# Patient Record
Sex: Male | Born: 1960 | Race: White | Hispanic: No | Smoking: Current every day smoker
Health system: Southern US, Community
[De-identification: ages and names within clinical notes are randomized; demographics above are authoritative.]

## PROBLEM LIST (undated history)

## (undated) DIAGNOSIS — N200 Calculus of kidney: Secondary | ICD-10-CM

## (undated) DIAGNOSIS — J449 Chronic obstructive pulmonary disease, unspecified: Secondary | ICD-10-CM

## (undated) DIAGNOSIS — F319 Bipolar disorder, unspecified: Secondary | ICD-10-CM

## (undated) DIAGNOSIS — K5792 Diverticulitis of intestine, part unspecified, without perforation or abscess without bleeding: Secondary | ICD-10-CM

## (undated) DIAGNOSIS — G9341 Metabolic encephalopathy: Secondary | ICD-10-CM

## (undated) DIAGNOSIS — F32A Depression, unspecified: Secondary | ICD-10-CM

## (undated) DIAGNOSIS — K529 Noninfective gastroenteritis and colitis, unspecified: Secondary | ICD-10-CM

## (undated) DIAGNOSIS — G7000 Myasthenia gravis without (acute) exacerbation: Secondary | ICD-10-CM

## (undated) DIAGNOSIS — F329 Major depressive disorder, single episode, unspecified: Secondary | ICD-10-CM

## (undated) DIAGNOSIS — G20 Parkinson's disease: Secondary | ICD-10-CM

## (undated) DIAGNOSIS — F419 Anxiety disorder, unspecified: Secondary | ICD-10-CM

## (undated) DIAGNOSIS — G40909 Epilepsy, unspecified, not intractable, without status epilepticus: Secondary | ICD-10-CM

## (undated) DIAGNOSIS — G43909 Migraine, unspecified, not intractable, without status migrainosus: Secondary | ICD-10-CM

## (undated) DIAGNOSIS — K859 Acute pancreatitis without necrosis or infection, unspecified: Secondary | ICD-10-CM

## (undated) DIAGNOSIS — G35 Multiple sclerosis: Secondary | ICD-10-CM

## (undated) DIAGNOSIS — I10 Essential (primary) hypertension: Secondary | ICD-10-CM

## (undated) DIAGNOSIS — K219 Gastro-esophageal reflux disease without esophagitis: Secondary | ICD-10-CM

## (undated) HISTORY — PX: APPENDECTOMY: SHX54

---

## 2016-06-04 DIAGNOSIS — R571 Hypovolemic shock: Secondary | ICD-10-CM

## 2016-06-04 DIAGNOSIS — N19 Unspecified kidney failure: Secondary | ICD-10-CM

## 2016-06-04 DIAGNOSIS — F10929 Alcohol use, unspecified with intoxication, unspecified: Secondary | ICD-10-CM

## 2016-06-04 DIAGNOSIS — F329 Major depressive disorder, single episode, unspecified: Secondary | ICD-10-CM

## 2016-06-04 DIAGNOSIS — E872 Acidosis: Secondary | ICD-10-CM

## 2016-07-19 ENCOUNTER — Inpatient Hospital Stay
Admission: AD | Admit: 2016-07-19 | Discharge: 2016-07-26 | DRG: 885 | Disposition: A | Discharge status: Home / Self Care | Payer: No Typology Code available for payment source | Source: Intra-hospital | Attending: Psychiatry | Admitting: Psychiatry

## 2016-07-19 DIAGNOSIS — Z79899 Other long term (current) drug therapy: Secondary | ICD-10-CM | POA: Diagnosis not present

## 2016-07-19 DIAGNOSIS — G20 Parkinson's disease: Secondary | ICD-10-CM | POA: Diagnosis present

## 2016-07-19 DIAGNOSIS — F17210 Nicotine dependence, cigarettes, uncomplicated: Secondary | ICD-10-CM | POA: Diagnosis present

## 2016-07-19 DIAGNOSIS — Z818 Family history of other mental and behavioral disorders: Secondary | ICD-10-CM | POA: Diagnosis not present

## 2016-07-19 DIAGNOSIS — Z87442 Personal history of urinary calculi: Secondary | ICD-10-CM

## 2016-07-19 DIAGNOSIS — Z915 Personal history of self-harm: Secondary | ICD-10-CM | POA: Diagnosis not present

## 2016-07-19 DIAGNOSIS — R45851 Suicidal ideations: Secondary | ICD-10-CM | POA: Diagnosis present

## 2016-07-19 DIAGNOSIS — G35 Multiple sclerosis: Secondary | ICD-10-CM | POA: Diagnosis present

## 2016-07-19 DIAGNOSIS — F1020 Alcohol dependence, uncomplicated: Secondary | ICD-10-CM | POA: Diagnosis present

## 2016-07-19 DIAGNOSIS — F333 Major depressive disorder, recurrent, severe with psychotic symptoms: Principal | ICD-10-CM | POA: Diagnosis present

## 2016-07-19 DIAGNOSIS — F4310 Post-traumatic stress disorder, unspecified: Secondary | ICD-10-CM | POA: Diagnosis present

## 2016-07-19 DIAGNOSIS — J449 Chronic obstructive pulmonary disease, unspecified: Secondary | ICD-10-CM | POA: Diagnosis present

## 2016-07-19 DIAGNOSIS — K219 Gastro-esophageal reflux disease without esophagitis: Secondary | ICD-10-CM | POA: Diagnosis present

## 2016-07-19 DIAGNOSIS — Z716 Tobacco abuse counseling: Secondary | ICD-10-CM

## 2016-07-19 DIAGNOSIS — I10 Essential (primary) hypertension: Secondary | ICD-10-CM | POA: Diagnosis present

## 2016-07-19 DIAGNOSIS — Z9049 Acquired absence of other specified parts of digestive tract: Secondary | ICD-10-CM

## 2016-07-19 DIAGNOSIS — Z882 Allergy status to sulfonamides status: Secondary | ICD-10-CM | POA: Diagnosis not present

## 2016-07-19 DIAGNOSIS — G40909 Epilepsy, unspecified, not intractable, without status epilepticus: Secondary | ICD-10-CM | POA: Diagnosis present

## 2016-07-19 DIAGNOSIS — S5291XA Unspecified fracture of right forearm, initial encounter for closed fracture: Secondary | ICD-10-CM | POA: Diagnosis present

## 2016-07-19 DIAGNOSIS — F17200 Nicotine dependence, unspecified, uncomplicated: Secondary | ICD-10-CM | POA: Diagnosis present

## 2016-07-19 HISTORY — DX: Gastro-esophageal reflux disease without esophagitis: K21.9

## 2016-07-19 HISTORY — DX: Parkinson's disease: G20

## 2016-07-19 HISTORY — DX: Essential (primary) hypertension: I10

## 2016-07-19 HISTORY — DX: Diverticulitis of intestine, part unspecified, without perforation or abscess without bleeding: K57.92

## 2016-07-19 HISTORY — DX: Anxiety disorder, unspecified: F41.9

## 2016-07-19 HISTORY — DX: Migraine, unspecified, not intractable, without status migrainosus: G43.909

## 2016-07-19 HISTORY — DX: Epilepsy, unspecified, not intractable, without status epilepticus: G40.909

## 2016-07-19 HISTORY — DX: Bipolar disorder, unspecified: F31.9

## 2016-07-19 HISTORY — DX: Acute pancreatitis without necrosis or infection, unspecified: K85.90

## 2016-07-19 HISTORY — DX: Noninfective gastroenteritis and colitis, unspecified: K52.9

## 2016-07-19 HISTORY — DX: Calculus of kidney: N20.0

## 2016-07-19 HISTORY — DX: Metabolic encephalopathy: G93.41

## 2016-07-19 HISTORY — DX: Chronic obstructive pulmonary disease, unspecified: J44.9

## 2016-07-19 HISTORY — DX: Depression, unspecified: F32.A

## 2016-07-19 HISTORY — DX: Multiple sclerosis: G35

## 2016-07-19 HISTORY — DX: Major depressive disorder, single episode, unspecified: F32.9

## 2016-07-19 HISTORY — DX: Myasthenia gravis without (acute) exacerbation: G70.00

## 2016-07-19 MED ORDER — ALUM & MAG HYDROXIDE-SIMETH 200-200-20 MG/5ML PO SUSP: 30.0000 mL | ORAL | Status: DC | PRN

## 2016-07-19 MED ORDER — GABAPENTIN 300 MG PO CAPS
300.0000 mg | ORAL_CAPSULE | Freq: Two times a day (BID) | ORAL | Status: DC
  Administered 2016-07-19 – 2016-07-20 (×2): 300 mg via ORAL
  Filled 2016-07-19 (×2): qty 1

## 2016-07-19 MED ORDER — ACETAMINOPHEN 500 MG PO TABS
1000.0000 mg | ORAL_TABLET | Freq: Four times a day (QID) | ORAL | Status: DC | PRN
  Administered 2016-07-19 – 2016-07-20 (×2): 1000 mg via ORAL
  Filled 2016-07-19 (×2): qty 2

## 2016-07-19 MED ORDER — TRAZODONE HCL 100 MG PO TABS
150.0000 mg | ORAL_TABLET | Freq: Every day | ORAL | Status: DC
  Administered 2016-07-19 – 2016-07-25 (×7): 150 mg via ORAL
  Filled 2016-07-19 (×7): qty 1

## 2016-07-19 MED ORDER — METOPROLOL TARTRATE 25 MG PO TABS
12.5000 mg | ORAL_TABLET | Freq: Two times a day (BID) | ORAL | Status: DC
  Administered 2016-07-19 – 2016-07-23 (×8): 12.5 mg via ORAL
  Filled 2016-07-19 (×8): qty 1

## 2016-07-19 MED ORDER — FAMOTIDINE 20 MG PO TABS
40.0000 mg | ORAL_TABLET | Freq: Every day | ORAL | Status: DC
  Administered 2016-07-19 – 2016-07-25 (×7): 40 mg via ORAL
  Filled 2016-07-19 (×7): qty 2

## 2016-07-19 MED ORDER — ACETAMINOPHEN 325 MG PO TABS: 650.0000 mg | ORAL_TABLET | Freq: Four times a day (QID) | ORAL | Status: DC | PRN

## 2016-07-19 MED ORDER — IBUPROFEN 400 MG PO TABS
800.0000 mg | ORAL_TABLET | Freq: Four times a day (QID) | ORAL | Status: DC | PRN
  Administered 2016-07-22: 800 mg via ORAL
  Filled 2016-07-19: qty 2

## 2016-07-19 MED ORDER — SERTRALINE HCL 100 MG PO TABS
100.0000 mg | ORAL_TABLET | Freq: Every day | ORAL | Status: DC
  Administered 2016-07-20: 100 mg via ORAL
  Filled 2016-07-19: qty 1

## 2016-07-19 MED ORDER — UMECLIDINIUM BROMIDE 62.5 MCG/INH IN AEPB
1.0000 | INHALATION_SPRAY | Freq: Every day | RESPIRATORY_TRACT | Status: DC
  Administered 2016-07-20 – 2016-07-26 (×7): 1 via RESPIRATORY_TRACT
  Filled 2016-07-19: qty 7

## 2016-07-19 MED ORDER — TRAZODONE HCL 100 MG PO TABS: 100.0000 mg | ORAL_TABLET | Freq: Every evening | ORAL | Status: DC | PRN

## 2016-07-19 MED ORDER — MAGNESIUM HYDROXIDE 400 MG/5ML PO SUSP: 30.0000 mL | Freq: Every day | ORAL | Status: DC | PRN
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
## 2016-07-19 NOTE — [facility] Group Notes (Deleted)
[facility] Group Notes:  (Nursing/MHT/Case Management/Adjunct)  Date:  07/19/2016  Time:  9:20 PM  Type of Therapy:  Group Therapy  Participation Level:  Active  Participation Quality:  Appropriate  Affect:  Appropriate  Cognitive:  Appropriate  Insight:  Good  Engagement in Group:  Engaged  Modes of Intervention:  Education  Summary of Progress/Problems:  Lawrence Stanton 07/19/2016, 9:20 PM

## 2016-07-19 NOTE — Tx Team (Signed)
Initial Treatment Plan 07/19/2016 9:59 PM [name]. TKZ:[id number]    PATIENT STRESSORS: Financial difficulties Marital or family conflict Occupational concerns Substance abuse   PATIENT STRENGTHS: Ability for insight Average or above average intelligence Capable of independent living Supportive family/friends   PATIENT IDENTIFIED PROBLEMS: Depression  Suicidal Ideation  Substance abuse "sobriety of course"    "coping skills"  "how to speak my mind when someone jumps on me"           DISCHARGE CRITERIA:  Adequate post-discharge living arrangements Improved stabilization in mood, thinking, and/or behavior Motivation to continue treatment in a less acute level of care Need for constant or close observation no longer present Reduction of life-threatening or endangering symptoms to within safe limits Verbal commitment to aftercare and medication compliance  PRELIMINARY DISCHARGE PLAN: Attend aftercare/continuing care group Attend 12-step recovery group Outpatient therapy Placement in alternative living arrangements  PATIENT/FAMILY INVOLVEMENT: This treatment plan has been presented to and reviewed with the patient, Lawrence Stanton., and/or family member.  The patient and family have been given the opportunity to ask questions and make suggestions.  Cristela Felt, RN 07/19/2016, 9:59 PM

## 2016-07-20 ENCOUNTER — Encounter: Payer: Self-pay | Admitting: Psychiatry

## 2016-07-20 DIAGNOSIS — F333 Major depressive disorder, recurrent, severe with psychotic symptoms: Principal | ICD-10-CM

## 2016-07-20 DIAGNOSIS — S5291XA Unspecified fracture of right forearm, initial encounter for closed fracture: Secondary | ICD-10-CM | POA: Diagnosis present

## 2016-07-20 DIAGNOSIS — I10 Essential (primary) hypertension: Secondary | ICD-10-CM | POA: Diagnosis present

## 2016-07-20 DIAGNOSIS — F1020 Alcohol dependence, uncomplicated: Secondary | ICD-10-CM | POA: Diagnosis present

## 2016-07-20 DIAGNOSIS — F4310 Post-traumatic stress disorder, unspecified: Secondary | ICD-10-CM | POA: Diagnosis present

## 2016-07-20 DIAGNOSIS — F17200 Nicotine dependence, unspecified, uncomplicated: Secondary | ICD-10-CM | POA: Diagnosis present

## 2016-07-20 DIAGNOSIS — K219 Gastro-esophageal reflux disease without esophagitis: Secondary | ICD-10-CM | POA: Diagnosis present

## 2016-07-20 MED ORDER — HYDROCODONE-ACETAMINOPHEN 5-325 MG PO TABS
1.0000 | ORAL_TABLET | Freq: Four times a day (QID) | ORAL | Status: DC | PRN
  Administered 2016-07-20 – 2016-07-22 (×7): 1 via ORAL
  Filled 2016-07-20 (×7): qty 1

## 2016-07-20 MED ORDER — QUETIAPINE FUMARATE 25 MG PO TABS
25.0000 mg | ORAL_TABLET | Freq: Three times a day (TID) | ORAL | Status: DC
  Administered 2016-07-20 – 2016-07-23 (×9): 25 mg via ORAL
  Filled 2016-07-20 (×9): qty 1

## 2016-07-20 MED ORDER — QUETIAPINE FUMARATE 100 MG PO TABS
100.0000 mg | ORAL_TABLET | Freq: Every day | ORAL | Status: DC
  Administered 2016-07-20 – 2016-07-22 (×3): 100 mg via ORAL
  Filled 2016-07-20 (×3): qty 1

## 2016-07-20 MED ORDER — GABAPENTIN 300 MG PO CAPS
300.0000 mg | ORAL_CAPSULE | Freq: Three times a day (TID) | ORAL | Status: DC
  Administered 2016-07-20 – 2016-07-26 (×18): 300 mg via ORAL
  Filled 2016-07-20 (×18): qty 1

## 2016-07-20 MED ORDER — PRAZOSIN HCL 2 MG PO CAPS
2.0000 mg | ORAL_CAPSULE | Freq: Two times a day (BID) | ORAL | Status: DC
  Administered 2016-07-20 – 2016-07-22 (×4): 2 mg via ORAL
  Filled 2016-07-20 (×4): qty 1

## 2016-07-20 MED ORDER — VENLAFAXINE HCL ER 75 MG PO CP24
150.0000 mg | ORAL_CAPSULE | Freq: Every day | ORAL | Status: DC
  Administered 2016-07-20 – 2016-07-26 (×7): 150 mg via ORAL
  Filled 2016-07-20 (×7): qty 2

## 2016-07-20 MED ORDER — NICOTINE 21 MG/24HR TD PT24
21.0000 mg | MEDICATED_PATCH | Freq: Every day | TRANSDERMAL | Status: DC
  Administered 2016-07-20 – 2016-07-26 (×7): 21 mg via TRANSDERMAL
  Filled 2016-07-20 (×7): qty 1

## 2016-07-20 NOTE — Progress Notes (Signed)
Recreation Therapy Notes  INPATIENT RECREATION THERAPY ASSESSMENT  Patient Details Name: Lawrence Stanton. MRN: [id number] DOB: 1961-06-05 Today's Date: 07/20/2016  Patient Stressors: Relationship, Death, Friends, Other (Comment) (Girlfriend has been stressful and pt thinks they are broken up; step father died recently; lack of supportive friends; finances)  Coping Skills:   Isolate, Arguments, Substance Abuse, Avoidance, Exercise, Music, Sports, Other (Comment) (Go to [facility])  Personal Challenges: Communication, Concentration, Expressing Yourself, Problem-Solving, Relationships, Self-Esteem/Confidence, Social Interaction, Stress Management, Substance Abuse, Trusting Others  Leisure Interests (2+):  Nature - Therapist, music, Individual - Other (Comment) Administrator, Civil Service)  Awareness of Community Resources:  Yes  Community Resources:  [employer], Programmer, systems  Current Use: No  If no, Barriers?: Other (Comment) (Been down and depressed - no energy or will to do anything)  Patient Strengths:  Good personality, looks good for his age  Patient Identified Areas of Improvement:  Communication, social activity  Current Recreation Participation:  Watching football  Patient Goal for Hospitalization:  To prepare himself for a 6 month program  [place] of Residence:  [place] of Residence:  [place]   Current [place] (including self-harm):  No  Current HI:  No  Consent to Intern Participation: N/A   Jacquelynn Cree, LRT/CTRS 07/20/2016, 4:04 PM
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
## 2016-07-20 NOTE — Progress Notes (Signed)
Recreation Therapy Notes  Date: 9.12.17 Time: 1:00 pm Location: Craft Room  Group Topic: Self-expression  Goal Area(s) Addresses:  Patient will be able to identify a color that represents each emotion. Patient will verbalize benefit of using art as a means of self-expression. Patient will verbalize one emotion experienced while participating in activity.  Behavioral Response: Attentive, Interactive  Intervention: The Colors Within Me  Activity: Patients were given blank face worksheets and instructed to pick a color for each emotion they were feeling and show on the face how much of that emotion they were feeling.  Education: LRT educated patients on other forms of self-expression.  Education Outcome: Acknowledges education/In group clarification offered/Needs additional education.   Clinical Observations/Feedback: Patient completed activity by picking a color for each emotion and showing how much of that emotion he was feeling on the worksheet. Patient contributed to group discussion by stating what emotions he was feeling, how his emotions affect his treatment in the hospital, how he sees his emotions changing once he is d/c, that it was helpful to see his emotions on paper and why, and what steps he can take to change his emotions.  Jacquelynn Cree, LRT/CTRS 07/20/2016 3:26 PM

## 2016-07-20 NOTE — Progress Notes (Signed)
D: Patient has been trying to make contact with girl friend all day . Voice he doesn't have any thing else to wear while in the hospital. Patient stated slept fair last night .Stated appetite is fair energy level  Is normal. Stated concentration is good . Stated on Depression scale 8 , hopeless 7 and anxiety . 8( low 0-10 high) Denies suicidal  homicidal ideations  .  No auditory hallucinations  No pain concerns . Appropriate ADL'S. Interacting with peers and staff.  Patient voice of withdrawal symptoms . Voice of going to meeting   Today. A: Encourage patient participation with unit programming . Instruction  Given on  Medication , verbalize understanding. R: Voice no other concerns. Staff continue to monitor

## 2016-07-20 NOTE — Tx Team (Signed)
Interdisciplinary Treatment and Diagnostic Plan Update  07/20/2016 Time of Session: 10:30am Grayling CongressJames Kenneth [facility] Jr[facility] MRN: [id number]  Principal Diagnosis: Major depressive disorder, recurrent, severe with psychotic features (HCC)  Secondary Diagnoses: Principal Problem:   Major depressive disorder, recurrent, severe with psychotic features (HCC) Active Problems:   Alcohol use disorder, severe, dependence (HCC)   GERD (gastroesophageal reflux disease)   HTN (hypertension)   PTSD (post-traumatic stress disorder)   Tobacco use disorder   Closed right forearm fracture   Current Medications:  Current Facility-Administered Medications  Medication Dose Route Frequency Provider Last Rate Last Dose  . alum & mag hydroxide-simeth (MAALOX/MYLANTA) 200-200-20 MG/5ML suspension 30 mL  30 mL Oral Q4H PRN Jolanta B Pucilowska, MD      . famotidine (PEPCID) tablet 40 mg  40 mg Oral QHS Jimmy FootmanAndrea Hernandez-Gonzalez, MD   40 mg at 07/19/16 2233  . gabapentin (NEURONTIN) capsule 300 mg  300 mg Oral TID Shari ProwsJolanta B Pucilowska, MD   300 mg at 07/20/16 1224  . HYDROcodone-acetaminophen (NORCO/VICODIN) 5-325 MG per tablet 1 tablet  1 tablet Oral Q6H PRN Shari ProwsJolanta B Pucilowska, MD   1 tablet at 07/20/16 1228  . ibuprofen (ADVIL,MOTRIN) tablet 800 mg  800 mg Oral Q6H PRN Jimmy FootmanAndrea Hernandez-Gonzalez, MD      . magnesium hydroxide (MILK OF MAGNESIA) suspension 30 mL  30 mL Oral Daily PRN Jolanta B Pucilowska, MD      . metoprolol tartrate (LOPRESSOR) tablet 12.5 mg  12.5 mg Oral BID Jimmy FootmanAndrea Hernandez-Gonzalez, MD   12.5 mg at 07/20/16 0829  . nicotine (NICODERM CQ - dosed in mg/24 hours) patch 21 mg  21 mg Transdermal Daily Jolanta B Pucilowska, MD   21 mg at 07/20/16 1223  . prazosin (MINIPRESS) capsule 2 mg  2 mg Oral BID Shari ProwsJolanta B Pucilowska, MD   2 mg at 07/20/16 1223  . QUEtiapine (SEROQUEL) tablet 100 mg  100 mg Oral QHS Jolanta B Pucilowska, MD      . QUEtiapine (SEROQUEL) tablet 25 mg  25 mg Oral TID Shari ProwsJolanta B  Pucilowska, MD   25 mg at 07/20/16 1224  . traZODone (DESYREL) tablet 150 mg  150 mg Oral QHS Jimmy FootmanAndrea Hernandez-Gonzalez, MD   150 mg at 07/19/16 2233  . umeclidinium bromide (INCRUSE ELLIPTA) 62.5 MCG/INH 1 puff  1 puff Inhalation Daily Jimmy FootmanAndrea Hernandez-Gonzalez, MD   1 puff at 07/20/16 0906  . venlafaxine XR (EFFEXOR-XR) 24 hr capsule 150 mg  150 mg Oral Q breakfast Jolanta B Pucilowska, MD   150 mg at 07/20/16 1223   PTA Medications: Prescriptions Prior to Admission  Medication Sig Dispense Refill Last Dose  . chlordiazePOXIDE (LIBRIUM) 5 MG capsule Take 5 mg by mouth 2 (two) times daily.     . famotidine (PEPCID) 40 MG tablet Take 40 mg by mouth at bedtime.     . gabapentin (NEURONTIN) 300 MG capsule Take 300 mg by mouth 2 (two) times daily.     . metoprolol tartrate (LOPRESSOR) 25 MG tablet Take 12.5 mg by mouth 2 (two) times daily.     . sertraline (ZOLOFT) 100 MG tablet Take 100 mg by mouth daily.     . traZODone (DESYREL) 150 MG tablet Take 150 mg by mouth at bedtime.     Marland Kitchen. umeclidinium bromide (INCRUSE ELLIPTA) 62.5 MCG/INH AEPB Inhale 1 puff into the lungs daily.       Treatment Modalities: Medication Management, Group therapy, Case management,  1 to 1 session with clinician, Psychoeducation, Recreational therapy.  Physician Treatment Plan for Primary Diagnosis: Major depressive disorder, recurrent, severe with psychotic features (HCC) Long Term Goal(s): Improvement in symptoms so as ready for discharge   Short Term Goals: Ability to identify changes in lifestyle to reduce recurrence of condition will improve, Ability to verbalize feelings will improve, Ability to demonstrate self-control will improve, Ability to identify and develop effective coping behaviors will improve, Ability to maintain clinical measurements within normal limits will improve, Compliance with prescribed medications will improve and Ability to identify triggers associated with substance abuse/mental health  issues will improve  Medication Management: Evaluate patient's response, side effects, and tolerance of medication regimen.  Therapeutic Interventions: 1 to 1 sessions, Unit Group sessions and Medication administration.  Evaluation of Outcomes: Progressing  Physician Treatment Plan for Secondary Diagnosis: Principal Problem:   Major depressive disorder, recurrent, severe with psychotic features (HCC) Active Problems:   Alcohol use disorder, severe, dependence (HCC)   GERD (gastroesophageal reflux disease)   HTN (hypertension)   PTSD (post-traumatic stress disorder)   Tobacco use disorder   Closed right forearm fracture  Long Term Goal(s): Improvement in symptoms so as ready for discharge  Short Term Goals: Ability to identify changes in lifestyle to reduce recurrence of condition will improve, Ability to verbalize feelings will improve, Ability to demonstrate self-control will improve, Ability to identify and develop effective coping behaviors will improve, Ability to maintain clinical measurements within normal limits will improve, Compliance with prescribed medications will improve and Ability to identify triggers associated with substance abuse/mental health issues will improve  Medication Management: Evaluate patient's response, side effects, and tolerance of medication regimen.  Therapeutic Interventions: 1 to 1 sessions, Unit Group sessions and Medication administration.  Evaluation of Outcomes: Progressing   RN Treatment Plan for Primary Diagnosis: Major depressive disorder, recurrent, severe with psychotic features (HCC) Long Term Goal(s): Knowledge of disease and therapeutic regimen to maintain health will improve  Short Term Goals: Ability to remain free from injury will improve, Ability to participate in decision making will improve and Compliance with prescribed medications will improve  Medication Management: RN will administer medications as ordered by provider, will  assess and evaluate patient's response and provide education to patient for prescribed medication. RN will report any adverse and/or side effects to prescribing provider.  Therapeutic Interventions: 1 on 1 counseling sessions, Psychoeducation, Medication administration, Evaluate responses to treatment, Monitor vital signs and CBGs as ordered, Perform/monitor CIWA, COWS, AIMS and Fall Risk screenings as ordered, Perform wound care treatments as ordered.  Evaluation of Outcomes: Progressing   LCSW Treatment Plan for Primary Diagnosis: Major depressive disorder, recurrent, severe with psychotic features (HCC) Long Term Goal(s): Safe transition to appropriate next level of care at discharge, Engage patient in therapeutic group addressing interpersonal concerns.  Short Term Goals: Engage patient in aftercare planning with referrals and resources, Increase emotional regulation, Identify triggers associated with mental health/substance abuse issues and Increase skills for wellness and recovery  Therapeutic Interventions: Assess for all discharge needs, 1 to 1 time with Social worker, Explore available resources and support systems, Assess for adequacy in community support network, Educate family and significant other(s) on suicide prevention, Complete Psychosocial Assessment, Interpersonal group therapy.  Evaluation of Outcomes: Progressing   Progress in Treatment: Attending groups: Yes. Participating in groups: Yes. Taking medication as prescribed: Yes. Toleration medication: Yes. Family/Significant other contact made: No, will contact:    Patient understands diagnosis: Yes. Discussing patient identified problems/goals with staff: Yes. Medical problems stabilized or resolved: Yes. Denies suicidal/homicidal ideation: No  Issues/concerns per patient self-inventory: No. Other:     New problem(s) identified: No, Describe:     New Short Term/Long Term Goal(s):  Discharge Plan or Barriers:  Discharge home with grilfriend and follow up with outpatient medication management and therapy  Reason for Continuation of Hospitalization: Depression Medication stabilization Suicidal ideation  Estimated Length of Stay:2-3 days  Attendees: Patient:Lawrence Stanton 07/20/2016 4:40 PM  Physician: Kristine Linea, MD 07/20/2016 4:40 PM  Nursing: Hulan Amato, RN 07/20/2016 4:40 PM  RN Care Manager: 07/20/2016 4:40 PM  Social Worker: Jake Shark, LCSW 07/20/2016 4:40 PM  Recreational Therapist: Hershal Coria RN 07/20/2016 4:40 PM  Other:  07/20/2016 4:40 PM  Other:  07/20/2016 4:40 PM  Other: 07/20/2016 4:40 PM    Scribe for Treatment Team: Jeryl Columbia, MSW, LCSW 07/20/2016 4:40 PM

## 2016-07-20 NOTE — Plan of Care (Signed)
Problem: Coping: Goal: Ability to cope will improve Outcome: Progressing Working on coping skills   

## 2016-07-20 NOTE — [facility] Group Notes (Signed)
[facility] LCSW Group Therapy   07/20/2016 9:30am  Type of Therapy: Group Therapy   Participation Level: Active   Participation Quality: Attentive, Sharing and Supportive   Affect: Appropriate  Cognitive: Alert and Oriented   Insight: Developing/Improving and Engaged   Engagement in Therapy: Developing/Improving and Engaged   Modes of Intervention: Clarification, Confrontation, Discussion, Education, Exploration,  Limit-setting, Orientation, Problem-solving, Rapport Building, Dance movement psychotherapist, Socialization and Support  Summary of Progress/Problems: The topic for group therapy was feelings about diagnosis. Pt actively participated in group discussion on their past and current diagnosis and how they feel towards this. Pt also identified how society and family members judge them, based on their diagnosis as well as stereotypes and stigmas. Pt reports his diagnoses as bipolar and anxiety. He stated that his emotions are typically sad and feelings of worthlessness. Patient identified cognitive distortions as "I am not good enough." Pt's action plan is to remove himself from toxic environments.    Hampton Abbot, MSW, LCSWA 07/20/2016, 11:08AM

## 2016-07-20 NOTE — [facility] Group Notes (Addendum)
Goals Group  Date/Time: 9:00 AM  Type of Therapy and Topic: Group Therapy: Goals Group: SMART Goals  ?  Participation Level: Moderate  ?  Description of Group:  ?  The purpose of a daily goals group is to assist and guide patients in setting recovery/wellness-related goals. The objective is to set goals as they relate to the crisis in which they were admitted. Patients will be using SMART goal modalities to set measurable goals. Characteristics of realistic goals will be discussed and patients will be assisted in setting and processing how one will reach their goal. Facilitator will also assist patients in applying interventions and coping skills learned in psycho-education groups to the SMART goal and process how one will achieve defined goal.  ?  Therapeutic Goals:  ?  -Patients will develop and document one goal related to or their crisis in which brought them into treatment.  -Patients will be guided by LCSW using SMART goal setting modality in how to set a measurable, attainable, realistic and time sensitive goal.  -Patients will process barriers in reaching goal.  -Patients will process interventions in how to overcome and successful in reaching goal.  ?  Patient's Goal: Invited but did not attend. ?  Therapeutic Modalities:  Motivational Interviewing  Cognitive Behavioral Therapy  Crisis Intervention Model  SMART goals setting   Hampton Abbot, MSW, LCSW-A 07/20/2016, 9:25AM

## 2016-07-21 NOTE — Progress Notes (Signed)
Calm and cooperative. Flat affect. Attends groups. Med compliant. Patient requires frequent redirection in setting boundaries with another male patient on the unit. Pt found holding hands in the hall and break room; and standing outside patients room. C/o right wrist pian. Norco 1 tab given @ 2143 and 0358 for pain PRN, no voiced thoughts of hurting himself. No AV/H noted. Will continue to monitor behavior.

## 2016-07-21 NOTE — Plan of Care (Signed)
Problem: Safety: Goal: Ability to remain free from injury will improve Outcome: Progressing Patient remains free from injury. No voiced thoughts of hurting himself. No injuries noted.
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
## 2016-07-21 NOTE — [facility] Group Notes (Signed)
[facility] Group Notes:  (Nursing/MHT/Case Management/Adjunct)  Date:  07/21/2016  Time:  7:00 AM  Type of Therapy:  Group Therapy  Participation Level:  Active  Participation Quality:  Appropriate                             Affect:  Appropriate  Cognitive:  Appropriate  Insight:  Appropriate  Engagement in Group:  Engaged  Modes of Intervention:  Discussion  Summary of Progress/Problems:  Burt Ek 07/21/2016, 7:00 AM

## 2016-07-21 NOTE — [facility] Group Notes (Signed)
[facility] Group Notes:  (Nursing/MHT/Case Management/Adjunct)  Date:  07/21/2016  Time:  9:15 PM  Type of Therapy:  Evening Wrap-up Group  Participation Level:  Did Not Attend  Participation Quality:  N/A  Affect:  N/A  Cognitive:  N/A  Insight:  None  Engagement in Group:  N/A  Modes of Intervention:  Activity  Summary of Progress/Problems:  Tomasita Morrow 07/21/2016, 9:15 PM

## 2016-07-21 NOTE — Progress Notes (Signed)
Recreation Therapy Notes  Date: 09.13.17 Time: 1:00 pm Location: Craft Room  Group Topic: Self-esteem  Goal Area(s) Addresses:  Patient will be able to identify benefit of self-esteem. Patient will be able to identify ways to increase self-esteem.  Behavioral Response: Attentive, Interactive  Intervention: Self-Portrait  Activity: Patients were instructed to draw their self-portrait of how they were feeling. Patients were instructed to write their name on a piece of paper and a positive trait. Patient then wrote positive traits about peers. Patient were instructed to draw their self-portrait of how they felt after reading the feedback from their peers.  Education: LRT educated patients on ways they can increase their self-esteem.  Education Outcome: Acknowledges education/In group clarification offered   Clinical Observations/Feedback: Patient completed activity by drawing both self-portraits. Patients wrote positive traits about self and peers. Patient contributed to group discussion by stating what was different about his faces, how he felt after reading his peers comments, that it was not difficult to believe the comments, and how he can improve his self-esteem.  Jacquelynn CreeGreene,Kennede Lusk M, LRT/CTRS 07/21/2016 3:22 PM

## 2016-07-21 NOTE — Plan of Care (Signed)
Problem: [facility] Participation in Recreation Therapeutic Interventions Goal: STG-Patient will identify at least five coping skills for ** STG: Coping Skills - Within 4 treatment sessions, patient will verbalize at least 5 coping skills for substance abuse in each of 2 treatment sessions to decrease substance abuse post d/c.  Outcome: Progressing Treatment Session 1; Completed 1 out of 2: At approximately 11:50 am, LRT met with patient in community room. Patient verbalized 5 coping skills for substance abuse. LRT educated patient on leisure and why it is important to implement into his schedule. LRT educated and provided patient with blank schedules to help him plan his day and try to avoid using substances. LRT educated patient on healthy support systems.  Leonette Monarch, LRT/CTRS 09.13.17 3:38 pm Goal: STG-Other Recreation Therapy Goal (Specify) STG: Stress Management - Within 4 treatment sessions, patient will verbalize understanding of the stress management techniques in each of 2 treatment sessions to increase stress management skills post d/c.  Outcome: Progressing Treatment Session 1; Completed 1 out of 2: At approximately 11:50 am, LRT met with patient in community room. LRT educated and provided patient with handouts on stress management techniques. Patient verbalized understanding. LRT encouraged patient to read over and practice the stress management techniques.  Leonette Monarch, LRT/CTRS 09.13.17 3:40 pm

## 2016-07-21 NOTE — [facility] Group Notes (Signed)
[facility] Group Notes:  (Nursing/MHT/Case Management/Adjunct)  Date:  07/21/2016  Time:  5:36 PM  Type of Therapy:  Psychoeducational Skills  Participation Level:  Active  Participation Quality:  Appropriate, Attentive and Sharing  Affect:  Appropriate  Cognitive:  Alert and Appropriate  Insight:  Appropriate  Engagement in Group:  Engaged  Modes of Intervention:  Discussion, Education and Support  Summary of Progress/Problems:  Lawrence Stanton [facility] 07/21/2016, 5:36 PM

## 2016-07-21 NOTE — Plan of Care (Signed)
Problem: Coping: Goal: Ability to demonstrate self-control will improve Outcome: Progressing Working on coping skills information sheet given

## 2016-07-21 NOTE — Progress Notes (Signed)
Patient c/o lightheadedness and dizziness. V/s obtained at 0630 [id number] sitting; b/p 86/58- HR 93 standing; b/p 74/60-HR 89 standing. Fluids encouraged offered and accepted. Pt escorted back to room via w/c. Will continue to monitor for safety.

## 2016-07-21 NOTE — Progress Notes (Signed)
Patient  Voice of  Working on his self today as a goal. Affect cheerful on  Approach . Appropriate ADL's and personal chores . Appetite good and voice no concerns around sleep.   energy level lowl. Stated concentration is good . Stated on Depression scale 8 , hopeless 7  and anxiety 9.( low 0-10 high)  Behavior incongruent with  Numbers given .Denies suicidal  homicidal ideations  .  No auditory hallucinations  No pain concerns . Appropriate ADL'S. Interacting with peers and staff.  A: Encourage patient participation with unit programming . Instruction  Given on  Medication , verbalize understanding. R: Voice no other concerns. Staff continue to monitor

## 2016-07-21 NOTE — Progress Notes (Signed)
Patient ambulatory, states feeling much better. Pt requesting to wash clothing. Denies dizziness and lightheadedness. OOB to DR. Interacting with peers and watching tv. No s/s of distress noted. No c/o pain/discomfort noted.

## 2016-07-21 NOTE — [facility] Group Notes (Signed)
[facility] LCSW Group Therapy   07/21/2016  9:30 am   Type of Therapy: Group Therapy   Participation Level: Active   Participation Quality: Attentive, Sharing and Supportive   Affect:Appropriate  Cognitive: Alert and Oriented   Insight: Developing/Improving and Engaged   Engagement in Therapy: Developing/Improving and Engaged   Modes of Intervention: Clarification, Confrontation, Discussion, Education, Exploration, Limit-setting, Orientation, Problem-solving, Rapport Building, Dance movement psychotherapist, Socialization and Support   Summary of Progress/Problems: The topic for group today was emotional regulation. This group focused on both positive and negative emotion identification and allowed  group members to process ways to identify feelings, regulate negative emotions, and find healthy ways to manage internal/external emotions. Group members were asked to reflect on a time when their reaction to an emotion led to a negative outcome and explored how alternative responses using emotion regulation would have benefited them. Group members were also asked to discuss a time when emotion regulation was utilized when a negative emotion was experienced. Patient defined emotional regulation as "taking a break, and walking away." Pt identified a trigger for negative emotions as his girlfriend and his substance use. Pt stated inpatient treatment could reduce exposure to triggers.    Hampton Abbot, MSW, LCSWA 07/21/2016, 10:38AM

## 2016-07-21 NOTE — Progress Notes (Signed)
0730-blood pressure 109/75, HR 73. No s/s of acute distress noted. No c/o pain/discomfort noted.
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
## 2016-07-22 MED ORDER — IBUPROFEN 400 MG PO TABS
400.0000 mg | ORAL_TABLET | Freq: Two times a day (BID) | ORAL | Status: DC
  Administered 2016-07-22 – 2016-07-26 (×8): 400 mg via ORAL
  Filled 2016-07-22 (×8): qty 1

## 2016-07-22 MED ORDER — HYDROCODONE-ACETAMINOPHEN 5-325 MG PO TABS
2.0000 | ORAL_TABLET | ORAL | Status: DC | PRN
  Administered 2016-07-22 – 2016-07-26 (×12): 2 via ORAL
  Filled 2016-07-22 (×12): qty 2

## 2016-07-22 MED ORDER — IBUPROFEN 400 MG PO TABS: 400.0000 mg | ORAL_TABLET | Freq: Three times a day (TID) | ORAL | Status: DC

## 2016-07-22 MED ORDER — INFLUENZA VAC SPLIT QUAD 0.5 ML IM SUSY
0.5000 mL | PREFILLED_SYRINGE | INTRAMUSCULAR | Status: AC
  Administered 2016-07-23: 0.5 mL via INTRAMUSCULAR
  Filled 2016-07-22: qty 0.5

## 2016-07-22 NOTE — [facility] Group Notes (Signed)
[facility] LCSW Group Therapy   07/22/2016 9:30 am   Type of Therapy: Group Therapy   Participation Level: Active   Participation Quality: Attentive, Sharing and Supportive   Affect: Appropriate   Cognitive: Alert and Oriented   Insight: Developing/Improving and Engaged   Engagement in Therapy: Developing/Improving and Engaged   Modes of Intervention: Clarification, Confrontation, Discussion, Education, Exploration, Limit-setting, Orientation, Problem-solving, Rapport Building, Dance movement psychotherapisteality Testing, Socialization and Support   Summary of Progress/Problems: The topic for group was balance in life. Today's group focused on defining balance in one's own words, identifying things that can knock one off balance, and exploring healthy ways to maintain balance in life. Group members were asked to provide an example of a time when they felt off balance, describe how they handled that situation, and process healthier ways to regain balance in the future. Group members were asked to share the most important tool for maintaining balance that [facility] and how they plan to apply this method after discharge. Pt defined balance as stability in one's life. He stated that his life becomes unbalanced when he does not have support from his family and starts drinking. Pt identified inpatient treatment and a change in his environment as his action plan to create or restore balance in his life.  Hampton AbbotKadijah Maurya Nethery, MSW, LCSW-A 07/22/2016, 10:41AM

## 2016-07-22 NOTE — [facility] Group Notes (Signed)
[facility] Group Notes:  (Nursing/MHT/Case Management/Adjunct)  Date:  07/22/2016  Time:  4:26 PM  Type of Therapy:  Psychoeducational Skills  Participation Level:  Active  Participation Quality:  Appropriate, Attentive and Sharing  Affect:  Appropriate  Cognitive:  Alert and Appropriate  Insight:  Appropriate  Engagement in Group:  Engaged  Modes of Intervention:  Discussion, Education and Support  Summary of Progress/Problems:  Lawrence Stanton [facility] 07/22/2016, 4:26 PM

## 2016-07-22 NOTE — Progress Notes (Signed)
D: Pt is seen in the milieu interacting appropriately with staff and peers. Denies SI/HI/AVH at this time. Pt requests PRN pain medication appropriately. Pt discusses with Clinical research associatewriter that he hopes to distance himself from his girlfriend during treatment because "she drinks too which isn't good for me." A: Emotional support and encouragement provided. Medications administered with education. q15 minute safety checks maintained. R: Pt remains free from harm. Will continue to monitor.

## 2016-07-22 NOTE — Plan of Care (Signed)
Problem: Coping: Goal: Ability to verbalize feelings will improve Outcome: Progressing Open with feelings today. Reports he is anxious about discharge with surgery and returning to get his belongings at his home where his gf is. Support and encouragement provided. Will continue to monitor.

## 2016-07-22 NOTE — Tx Team (Signed)
Interdisciplinary Treatment and Diagnostic Plan Update  07/22/2016 Time of Session: 2:00pm Lawrence Stanton [facility]. MRN: [id number]  Principal Diagnosis: Major depressive disorder, recurrent, severe with psychotic features (HCC)  Secondary Diagnoses: Principal Problem:   Major depressive disorder, recurrent, severe with psychotic features (HCC) Active Problems:   Alcohol use disorder, severe, dependence (HCC)   GERD (gastroesophageal reflux disease)   HTN (hypertension)   PTSD (post-traumatic stress disorder)   Tobacco use disorder   Closed right forearm fracture   Current Medications:  Current Facility-Administered Medications  Medication Dose Route Frequency Provider Last Rate Last Dose  . alum & mag hydroxide-simeth (MAALOX/MYLANTA) 200-200-20 MG/5ML suspension 30 mL  30 mL Oral Q4H PRN Jolanta B Pucilowska, MD      . famotidine (PEPCID) tablet 40 mg  40 mg Oral QHS Jimmy Footman, MD   40 mg at 07/21/16 2130  . gabapentin (NEURONTIN) capsule 300 mg  300 mg Oral TID Shari Prows, MD   300 mg at 07/22/16 1154  . HYDROcodone-acetaminophen (NORCO/VICODIN) 5-325 MG per tablet 2 tablet  2 tablet Oral Q4H PRN Jimmy Footman, MD   2 tablet at 07/22/16 1324  . ibuprofen (ADVIL,MOTRIN) tablet 400 mg  400 mg Oral BID WC Jimmy Footman, MD      . Melene Muller ON 07/23/2016] Influenza vac split quadrivalent PF (FLUARIX) injection 0.5 mL  0.5 mL Intramuscular Tomorrow-1000 Jolanta B Pucilowska, MD      . magnesium hydroxide (MILK OF MAGNESIA) suspension 30 mL  30 mL Oral Daily PRN Jolanta B Pucilowska, MD      . metoprolol tartrate (LOPRESSOR) tablet 12.5 mg  12.5 mg Oral BID Jimmy Footman, MD   12.5 mg at 07/22/16 0825  . nicotine (NICODERM CQ - dosed in mg/24 hours) patch 21 mg  21 mg Transdermal Daily Shari Prows, MD   21 mg at 07/22/16 0828  . QUEtiapine (SEROQUEL) tablet 100 mg  100 mg Oral QHS Jolanta B Pucilowska, MD   100 mg at  07/21/16 2130  . QUEtiapine (SEROQUEL) tablet 25 mg  25 mg Oral TID Shari Prows, MD   25 mg at 07/22/16 1154  . traZODone (DESYREL) tablet 150 mg  150 mg Oral QHS Jimmy Footman, MD   150 mg at 07/21/16 2130  . umeclidinium bromide (INCRUSE ELLIPTA) 62.5 MCG/INH 1 puff  1 puff Inhalation Daily Jimmy Footman, MD   1 puff at 07/22/16 0828  . venlafaxine XR (EFFEXOR-XR) 24 hr capsule 150 mg  150 mg Oral Q breakfast Jolanta B Pucilowska, MD   150 mg at 07/22/16 0825   PTA Medications: Prescriptions Prior to Admission  Medication Sig Dispense Refill Last Dose  . chlordiazePOXIDE (LIBRIUM) 5 MG capsule Take 5 mg by mouth 2 (two) times daily.     . famotidine (PEPCID) 40 MG tablet Take 40 mg by mouth at bedtime.     . gabapentin (NEURONTIN) 300 MG capsule Take 300 mg by mouth 2 (two) times daily.     . metoprolol tartrate (LOPRESSOR) 25 MG tablet Take 12.5 mg by mouth 2 (two) times daily.     . sertraline (ZOLOFT) 100 MG tablet Take 100 mg by mouth daily.     . traZODone (DESYREL) 150 MG tablet Take 150 mg by mouth at bedtime.     Marland Kitchen umeclidinium bromide (INCRUSE ELLIPTA) 62.5 MCG/INH AEPB Inhale 1 puff into the lungs daily.       Treatment Modalities: Medication Management, Group therapy, Case management,  1 to 1 session  with clinician, Psychoeducation, Recreational therapy.   Physician Treatment Plan for Primary Diagnosis: Major depressive disorder, recurrent, severe with psychotic features (HCC) Long Term Goal(s): Improvement in symptoms so as ready for discharge   Short Term Goals: Ability to identify changes in lifestyle to reduce recurrence of condition will improve, Ability to verbalize feelings will improve, Ability to demonstrate self-control will improve, Ability to identify and develop effective coping behaviors will improve, Ability to maintain clinical measurements within normal limits will improve, Compliance with prescribed medications will improve and  Ability to identify triggers associated with substance abuse/mental health issues will improve  Medication Management: Evaluate patient's response, side effects, and tolerance of medication regimen.  Therapeutic Interventions: 1 to 1 sessions, Unit Group sessions and Medication administration.  Evaluation of Outcomes: Progressing  Physician Treatment Plan for Secondary Diagnosis: Principal Problem:   Major depressive disorder, recurrent, severe with psychotic features (HCC) Active Problems:   Alcohol use disorder, severe, dependence (HCC)   GERD (gastroesophageal reflux disease)   HTN (hypertension)   PTSD (post-traumatic stress disorder)   Tobacco use disorder   Closed right forearm fracture  Long Term Goal(s): Improvement in symptoms so as ready for discharge  Short Term Goals: Ability to identify changes in lifestyle to reduce recurrence of condition will improve, Ability to verbalize feelings will improve, Ability to demonstrate self-control will improve, Ability to identify and develop effective coping behaviors will improve, Ability to maintain clinical measurements within normal limits will improve, Compliance with prescribed medications will improve and Ability to identify triggers associated with substance abuse/mental health issues will improve  Medication Management: Evaluate patient's response, side effects, and tolerance of medication regimen.  Therapeutic Interventions: 1 to 1 sessions, Unit Group sessions and Medication administration.  Evaluation of Outcomes: Progressing   RN Treatment Plan for Primary Diagnosis: Major depressive disorder, recurrent, severe with psychotic features (HCC) Long Term Goal(s): Knowledge of disease and therapeutic regimen to maintain health will improve  Short Term Goals: Ability to remain free from injury will improve, Ability to participate in decision making will improve and Compliance with prescribed medications will  improve  Medication Management: RN will administer medications as ordered by provider, will assess and evaluate patient's response and provide education to patient for prescribed medication. RN will report any adverse and/or side effects to prescribing provider.  Therapeutic Interventions: 1 on 1 counseling sessions, Psychoeducation, Medication administration, Evaluate responses to treatment, Monitor vital signs and CBGs as ordered, Perform/monitor CIWA, COWS, AIMS and Fall Risk screenings as ordered, Perform wound care treatments as ordered.  Evaluation of Outcomes: Progressing   LCSW Treatment Plan for Primary Diagnosis: Major depressive disorder, recurrent, severe with psychotic features (HCC) Long Term Goal(s): Safe transition to appropriate next level of care at discharge, Engage patient in therapeutic group addressing interpersonal concerns.  Short Term Goals: Engage patient in aftercare planning with referrals and resources, Increase emotional regulation, Identify triggers associated with mental health/substance abuse issues and Increase skills for wellness and recovery  Therapeutic Interventions: Assess for all discharge needs, 1 to 1 time with Social worker, Explore available resources and support systems, Assess for adequacy in community support network, Educate family and significant other(s) on suicide prevention, Complete Psychosocial Assessment, Interpersonal group therapy.  Evaluation of Outcomes: Progressing   Progress in Treatment: Attending groups: Yes. Participating in groups: Yes. Taking medication as prescribed: Yes. Toleration medication: Yes. Family/Significant other contact made: Yes, individual(s) contacted:  mother Patient understands diagnosis: Yes. Discussing patient identified problems/goals with staff: Yes. Medical problems stabilized or  resolved: Yes. Denies suicidal/homicidal ideation: Yes. Issues/concerns per patient self-inventory: No. Other: n/a  New  problem(s) identified: None identified at this time.   New Short Term/Long Term Goal(s): None identified at this time.   Discharge Plan or Barriers: Patient will discharge to his mothers house and have follow-up with [facility].  Reason for Continuation of Hospitalization: Depression Medication stabilization  Estimated Length of Stay: 3 to 5 days  Attendees: Patient:Lawrence Ward ChattersKenneth Mccaster Jr. 07/22/2016 2:28 PM  Physician: Dr. Radene JourneyAndrea HernandezJayme Cloud- Gonzalez, MD 07/22/2016 2:28 PM  Nursing: Elenore PaddyJennifer Morrow, RN 07/22/2016 2:28 PM  RN Care Manager: 07/22/2016 2:28 PM  Social Worker: Fredrich BirksAmaris G. Garnette CzechSampson MSW, LCSWA 07/22/2016 2:28 PM  Recreational Therapist:  07/22/2016 2:28 PM  Other:  07/22/2016 2:28 PM  Other:  07/22/2016 2:28 PM  Other: 07/22/2016 2:28 PM    Scribe for Treatment Team: Arelia LongestAmaris G Mirabelle Cyphers, LCSWA 07/22/2016 2:34 PM

## 2016-07-22 NOTE — Plan of Care (Signed)
Problem: Medication: Goal: Compliance with prescribed medication regimen will improve Outcome: Progressing Pt taking medications as prescribed.  Problem: Health Behavior/Discharge Planning: Goal: Ability to identify changes in lifestyle to reduce recurrence of condition will improve Outcome: Progressing Pt discusses with writer his plan to attend a treatment facility. He states that he is working on distancing himself from his girlfriend d/t her drinking habits.
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
## 2016-07-22 NOTE — Progress Notes (Signed)
Pt awake, up on unit, appropriately socializing with peers today. Denies SI/HI/AVH. C/o tremors this morning. C/o pain to R wrist 8-9/10 most of the day. Takes pain med as ordered. Reports depression 8/10, hopelessness 7/10, and anxiety 8/10 (low 0-10 high). Medication compliant.   Safety maintained with every 15 minute checks. Support and encouragement provided. Medications administered as ordered. Will continue to monitor.

## 2016-07-23 MED ORDER — RISPERIDONE 1 MG PO TABS
2.0000 mg | ORAL_TABLET | Freq: Every day | ORAL | Status: DC
  Administered 2016-07-23 – 2016-07-25 (×3): 2 mg via ORAL
  Filled 2016-07-23 (×3): qty 2

## 2016-07-23 NOTE — Plan of Care (Signed)
Problem: Activity: Goal: Interest or engagement in leisure activities will improve Outcome: Progressing Patient actively attending group sessions.  Patient is showing interest in leisure activities.

## 2016-07-23 NOTE — [facility] Group Notes (Signed)
[facility] Group Notes:  (Nursing/MHT/Case Management/Adjunct)  Date:  07/23/2016  Time:  2:42 AM  Type of Therapy:  Group Therapy  Participation Level:  Active  Participation Quality:  Appropriate  Affect:  Appropriate  Cognitive:  Appropriate  Insight:  Appropriate  Engagement in Group:  Engaged  Modes of Intervention:  n/a  Summary of Progress/Problems:  Lawrence Stanton 07/23/2016, 2:42 AM

## 2016-07-23 NOTE — [facility] Suicide Risk Assessment (Signed)
[facility] INPATIENT:  Family/Significant Other Suicide Prevention Education  Suicide Prevention Education: (Late Entry from 07/22/2016) Education Completed;Michiel Sites (mother [phone]), has been identified by the patient as the family member/significant other with whom the patient will be residing, and identified as the person(s) who will aid the patient in the event of a mental health crisis (suicidal ideations/suicide attempt).  With written consent from the patient, the family member/significant other has been provided the following suicide prevention education, prior to the and/or following the discharge of the patient. Mother indicated she was okay with patient coming to live with her when discharged.   The suicide prevention education provided includes the following:  Suicide risk factors  Suicide prevention and interventions  National Suicide Hotline telephone number  [facility] assessment telephone number  [facility] Emergency Assistance 911  [facility] and/or Residential Mobile Crisis Unit telephone number  Request made of family/significant other to:  Remove weapons (e.g., guns, rifles, knives), all items previously/currently identified as safety concern.    Remove drugs/medications (over-the-counter, prescriptions, illicit drugs), all items previously/currently identified as a safety concern.  The family member/significant other verbalizes understanding of the suicide prevention education information provided.  The family member/significant other agrees to remove the items of safety concern listed above.  Dorien Bessent G. Garnette Czech MSW, LCSWA 07/23/2016, 9:02 AM
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
## 2016-07-23 NOTE — Plan of Care (Signed)
Problem: [facility] Participation in Recreation Therapeutic Interventions Goal: STG-Patient will identify at least five coping skills for ** STG: Coping Skills - Within 4 treatment sessions, patient will verbalize at least 5 coping skills for substance abuse in each of 2 treatment sessions to decrease substance abuse post d/c.  Outcome: Completed/Met Date Met: 07/23/16 Treatment Session 2; Completed 2 out of 2: At approximately 11:35 am, LRT met with patient in community room. Patient verbalized 5 coping skills for substance abuse. LRT encouraged patient to use his healthy coping skills to avoid using substances.  Leonette Monarch, LRT/CTRS 09.15.19 11:42 am Goal: STG-Other Recreation Therapy Goal (Specify) STG: Stress Management - Within 4 treatment sessions, patient will verbalize understanding of the stress management techniques in each of 2 treatment sessions to increase stress management skills post d/c.  Outcome: Completed/Met Date Met: 07/23/16 Treatment Session 2; Completed 2 out of 2: At approximately 11:35 am, LRT met with patient in community room. Patient reported he read over and practiced the stress management techniques. Patient verbalized understanding and reported the techniques were "somewhat helpful". LRT encouraged patient to continue practicing the stress management techniques.  Leonette Monarch, LRT/CTRS 09.15.17 11:45 am

## 2016-07-23 NOTE — Plan of Care (Signed)
Problem: Health Behavior/Discharge Planning: Goal: Compliance with therapeutic regimen will improve Outcome: Progressing Pt compliant with medication regimen.

## 2016-07-23 NOTE — Progress Notes (Signed)
Recreation Therapy Notes  Date: 09.15.17 Time: 1:00 pm Location: Craft Room  Group Topic: Leisure Education  Goal Area(s) Addresses:  Patient will identify activities for each letter of the alphabet. Patient will verbalize ability to integrate positive leisure into life post d/c. Patient will verbalize ability to use leisure as a Associate Professor.  Behavioral Response: Attentive, Interactive  Intervention: Leisure Alphabet  Activity: Patients were given a Leisure Information systems manager and instructed to pick healthy leisure activities for each letter of the alphabet.  Education: LRT educated patients on what they need to participate in leisure activities.  Education Outcome: Acknowledges education/In group clarification offered  Clinical Observations/Feedback: Patient completed activity by writing healthy leisure activities. Patient contributed to group discussion by stating healthy leisure activities, what he needs to participate in leisure, how he can put leisure activities back in his schedule, and what makes leisure a good coping skill.  Jacquelynn Cree, LRT/CTRS 07/23/2016 1:52 PM

## 2016-07-23 NOTE — Progress Notes (Signed)
D:  Patient denies SI/AVH/HI.  Patient interacted more with staff and peers today.  Patient complained of chronic back pain. A:  Patient administered scheduled medications.  Patient provided with a safe environment.  Patient encouraged to attend groups. R:  Patient cooperative with staff.  Patient safety maintained with 15 minute checks.
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
## 2016-07-23 NOTE — [facility] Group Notes (Signed)
[facility] LCSW Group Therapy   07/23/2016 9:30 AM   Type of Therapy: Group Therapy   Participation Level: Active   Participation Quality: Attentive, Sharing and Supportive   Affect: Appropriate   Cognitive: Alert and Oriented   Insight: Developing/Improving and Engaged   Engagement in Therapy: Developing/Improving and Engaged   Modes of Intervention: Clarification, Confrontation, Discussion, Education, Exploration, Limit-setting, Orientation, Problem-solving, Rapport Building, Dance movement psychotherapist, Socialization and Support   Summary of Progress/Problems: The topic for today was feelings about relapse. Pt discussed what relapse prevention is to them and identified triggers that they are on the path to relapse. Pt processed their feeling towards relapse and was able to relate to peers. Pt discussed coping skills that can be used for relapse prevention. Patient defined relapse as "trying to get help, but the help doesn't work." Pt stated that his triggers for relapse are conflict with his loved ones and going back to toxic environments. Pt identified creating healthy relationships and staying in treatment as ways to reduce relapse and continue his journey in recovery.     Hampton Abbot, MSW, [facility]

## 2016-07-24 LAB — LIPID PANEL
Cholesterol: 188 mg/dL (ref 0–200)
HDL: 54 mg/dL (ref >40)
LDL CALC: 100 mg/dL — AB (ref 0–99)
Total CHOL/HDL Ratio: 3.5 RATIO
Triglycerides: 169 mg/dL — ABNORMAL HIGH (ref <150)
VLDL: 34 mg/dL (ref 0–40)

## 2016-07-24 NOTE — Progress Notes (Signed)
D:Pt denies any SI/HI/AVH. Attended evening group. Complained of back pain. Affect depressed. Visible in milieu interacting appropriately with peers and staff.  A: Encouragement and support provided. PRN Norco given for pain, effective. q15 minute checks maintained for safety.  R: Remains safe on unit. Participates in his treatment plan. Appropriate during interaction. Voices no additional concerns at this time 
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
## 2016-07-24 NOTE — [facility] Group Notes (Signed)
[facility] LCSW Group Therapy  07/24/2016 2:24 PM  Type of Therapy:  Group Therapy  Participation Level:  Active  Participation Quality:  Appropriate and Sharing  Affect:  Appropriate  Cognitive:  Alert and Appropriate  Insight:  Developing/Improving  Engagement in Therapy:  Developing/Improving  Modes of Intervention:  Activity, Discussion, Education and Support  Summary of Progress/Problems:Safety Planning: Patients identified fears or worries surrounding discharge. Patients offered support to their peers and openly developed safety plans for their individual needs. Patients developed their own safety plan. Patients discussed their warning signs, coping strategies, support system with family and friends, identified mental health professionals, and how to keep their environments safe (ex. Removing unnecessary medications or removing weapons/guns). Patients then discussed their personalized safety plan with the group. Pt stated he has spoke with his girlfriend and let her know he thinks it's best to live with his mother when he discharges. Patient has insight that his drinking is an issue and wants to avoid his girlfriend due to his girlfriend drinking alcohol daily.    Karolyn Messing G. Garnette CzechSampson MSW, LCSWA 07/24/2016, 2:27 PM

## 2016-07-24 NOTE — Progress Notes (Signed)
D:  Patient denies SI/AVH/HI.  Patient alert and oriented x4.  Patient complained of chronic back and wrist pain. A:  Patient encouraged to attend group sessions.  Patient administered scheduled medications. R:  Patient safety maintained with 15 minute checks.  Patient attended group sessions.

## 2016-07-24 NOTE — [facility] Group Notes (Signed)
[facility] Group Notes:  (Nursing/MHT/Case Management/Adjunct)  Date:  07/24/2016  Time:  1:27 AM  Type of Therapy:  Psychoeducational Skills  Participation Level:  Active  Participation Quality:  Appropriate, Attentive, Sharing and Supportive  Affect:  Appropriate  Cognitive:  Alert and Appropriate  Insight:  Appropriate and Good  Engagement in Group:  Engaged  Modes of Intervention:  Discussion, Socialization and Support  Summary of Progress/Problems:  Lawrence Stanton 07/24/2016, 1:27 AM

## 2016-07-24 NOTE — Plan of Care (Signed)
Problem: Safety: Goal: Ability to remain free from injury will improve Outcome: Progressing Patient has remained free from injury.  Patient safety maintained with 15 minute checks.   

## 2016-07-25 LAB — HEMOGLOBIN A1C
Hgb A1c MFr Bld: 5.4 % (ref 4.8–5.6)
Mean Plasma Glucose: 108 mg/dL

## 2016-07-25 NOTE — Progress Notes (Signed)
D:Pt denies any SI/HI/AVH. Attended evening group. Complained of back pain. Affect depressed. Visible in milieu interacting appropriately with peers and staff.  A: Encouragement and support provided. PRN Norco given for pain, effective. q15 minute checks maintained for safety.  R: Remains safe on unit. Participates in his treatment plan. Appropriate during interaction. Voices no additional concerns at this time 

## 2016-07-25 NOTE — Progress Notes (Signed)
D: Patient denies SI/AVH/HI.  Patient alert and oriented x4.  Patient continues to complain of pain in his wrist and his back.  Patient interacting appropriately with peers and staff. A:  Patient offered encouragement and support.  Patient administered scheduled medications. R:  Patient attended group sessions and was appropriate and engaging in those groups.  Patient safety maintained with 15 minute checks.

## 2016-07-25 NOTE — [facility] Group Notes (Signed)
[facility] Group Notes:  (Nursing/MHT/Case Management/Adjunct)  Date:  07/25/2016  Time:  11:41 PM  Type of Therapy:  Psychoeducational Skills  Participation Level:  Active  Participation Quality:  Attentive  Affect:  Appropriate  Cognitive:  Appropriate  Insight:  Good  Engagement in Group:  Engaged  Modes of Intervention:  Support  Summary of Progress/Problems:  Lawrence Stanton 07/25/2016, 11:41 PM

## 2016-07-25 NOTE — Plan of Care (Signed)
Problem: Activity: Goal: Sleeping patterns will improve Outcome: Progressing Pt has been sleeping at least 6.5 hrs at night.

## 2016-07-25 NOTE — [facility] Group Notes (Signed)
[facility] LCSW Group Therapy  07/25/2016 1:58 PM  Type of Therapy:  Group Therapy  Participation Level:  Active  Participation Quality:  Appropriate  Affect:  Appropriate  Cognitive:  Alert  Insight:  Developing/Improving  Engagement in Therapy:  Engaged  Modes of Intervention:  Discussion, Education and Support  Summary of Progress/Problems: Self esteem: Patients discussed self esteem and how it impacts them. They discussed what aspects in their lives has influenced their self esteem. They were challenged to identify changes that are needed in order to improve self esteem. Patients participated in activity where they had to identify positive adjectives they felt described their personality. Patients shared with the group on the following areas: Things I am good at, What I like about my appearance, I've helped others by, What I value the most, compliments I have received, challenges I have overcome, thing that make me unique, and Times I've made others happy. Pt stated he feels he is a humble, kind, and open minded person. Pt states he is keeping an open mind about the residential program he will be going to shortly after discharge.    Loredana Medellin G. Garnette Czech MSW, LCSWA 07/25/2016, 1:58 PM

## 2016-07-25 NOTE — Plan of Care (Signed)
Problem: Safety: Goal: Periods of time without injury will increase Outcome: Progressing Patient has remained free from injury this shift.  Patient provided with a safe environment.  Patient safety maintained with 15 minute checks.
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
## 2016-07-26 MED ORDER — TRAZODONE HCL 150 MG PO TABS: 150.0000 mg | ORAL_TABLET | Freq: Every day | ORAL | 0 refills | Status: DC

## 2016-07-26 MED ORDER — GABAPENTIN 300 MG PO CAPS: 300.0000 mg | ORAL_CAPSULE | Freq: Three times a day (TID) | ORAL | 0 refills | Status: DC

## 2016-07-26 MED ORDER — VENLAFAXINE HCL ER 150 MG PO CP24: 150.0000 mg | ORAL_CAPSULE | Freq: Every day | ORAL | 0 refills | Status: DC

## 2016-07-26 MED ORDER — RISPERIDONE 2 MG PO TABS: 2.0000 mg | ORAL_TABLET | Freq: Every day | ORAL | 0 refills | Status: DC

## 2016-07-26 NOTE — Progress Notes (Signed)
D:Pt denies any SI/HI/AVH. Attended evening group. Complained of back pain. Affect depressed. Visible in milieu interacting appropriately with peers and staff.  A: Encouragement and support provided. PRN Norco given for pain, effective. q15 minute checks maintained for safety.  R: Remains safe on unit. Participates in his treatment plan. Appropriate during interaction. Voices no additional concerns at this time

## 2016-07-26 NOTE — Progress Notes (Signed)
Patient denies SI/HI, denies A/V hallucinations. Patient verbalizes understanding of discharge instructions, follow up care and prescriptions. Patient given all belongings from  locker. Patient escorted out by staff, transported by family. 
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
## 2016-07-26 NOTE — [facility] Group Notes (Signed)
[facility] LCSW Group Therapy   07/26/2016 9:30am Type of Therapy: Group Therapy: Overcoming Obstacles    Participation Level: Active   Participation Quality: Pt was unable to participate due to discharge.   Hampton AbbotKadijah Larua Collier, MSW, Theresia MajorsLCSWA

## 2016-07-26 NOTE — Tx Team (Signed)
Interdisciplinary Treatment and Diagnostic Plan Update  07/26/2016 Time of Session: 9:00am Lawrence Stanton [facility] MRN: [id number]  Principal Diagnosis: Major depressive disorder, recurrent, severe with psychotic features (HCC)  Secondary Diagnoses: Principal Problem:   Major depressive disorder, recurrent, severe with psychotic features (HCC) Active Problems:   Alcohol use disorder, severe, dependence (HCC)   GERD (gastroesophageal reflux disease)   HTN (hypertension)   PTSD (post-traumatic stress disorder)   Tobacco use disorder   Closed right forearm fracture   Current Medications:  No current facility-administered medications for this encounter.    Current Outpatient Prescriptions  Medication Sig Dispense Refill  . famotidine (PEPCID) 40 MG tablet Take 40 mg by mouth at bedtime.    Marland Kitchen umeclidinium bromide (INCRUSE ELLIPTA) 62.5 MCG/INH AEPB Inhale 1 puff into the lungs daily.    Marland Kitchen gabapentin (NEURONTIN) 300 MG capsule Take 1 capsule (300 mg total) by mouth 3 (three) times daily. 90 capsule 0  . risperiDONE (RISPERDAL) 2 MG tablet Take 1 tablet (2 mg total) by mouth at bedtime. 30 tablet 0  . traZODone (DESYREL) 150 MG tablet Take 1 tablet (150 mg total) by mouth at bedtime. 30 tablet 0  . [START ON 07/27/2016] venlafaxine XR (EFFEXOR-XR) 150 MG 24 hr capsule Take 1 capsule (150 mg total) by mouth daily with breakfast. 30 capsule 0   PTA Medications: No prescriptions prior to admission.    Treatment Modalities: Medication Management, Group therapy, Case management,  1 to 1 session with clinician, Psychoeducation, Recreational therapy.   Physician Treatment Plan for Primary Diagnosis: Major depressive disorder, recurrent, severe with psychotic features (HCC) Long Term Goal(s): Improvement in symptoms so as ready for discharge   Short Term Goals: Ability to identify changes in lifestyle to reduce recurrence of condition will improve, Ability to verbalize feelings will  improve, Ability to demonstrate self-control will improve, Ability to identify and develop effective coping behaviors will improve, Ability to maintain clinical measurements within normal limits will improve, Compliance with prescribed medications will improve and Ability to identify triggers associated with substance abuse/mental health issues will improve  Medication Management: Evaluate patient's response, side effects, and tolerance of medication regimen.  Therapeutic Interventions: 1 to 1 sessions, Unit Group sessions and Medication administration.  Evaluation of Outcomes:Adequate for discharge  Physician Treatment Plan for Secondary Diagnosis: Principal Problem:   Major depressive disorder, recurrent, severe with psychotic features (HCC) Active Problems:   Alcohol use disorder, severe, dependence (HCC)   GERD (gastroesophageal reflux disease)   HTN (hypertension)   PTSD (post-traumatic stress disorder)   Tobacco use disorder   Closed right forearm fracture  Long Term Goal(s): Improvement in symptoms so as ready for discharge  Short Term Goals: Ability to identify changes in lifestyle to reduce recurrence of condition will improve, Ability to verbalize feelings will improve, Ability to demonstrate self-control will improve, Ability to identify and develop effective coping behaviors will improve, Ability to maintain clinical measurements within normal limits will improve, Compliance with prescribed medications will improve and Ability to identify triggers associated with substance abuse/mental health issues will improve  Medication Management: Evaluate patient's response, side effects, and tolerance of medication regimen.  Therapeutic Interventions: 1 to 1 sessions, Unit Group sessions and Medication administration.  Evaluation of Outcomes: Adequate for discharge    RN Treatment Plan for Primary Diagnosis: Major depressive disorder, recurrent, severe with psychotic  features (HCC) Long Term Goal(s): Knowledge of disease and therapeutic regimen to maintain health will improve  Short Term Goals: Ability to remain free  from injury will improve, Ability to participate in decision making will improve and Compliance with prescribed medications will improve  Medication Management: RN will administer medications as ordered by provider, will assess and evaluate patient's response and provide education to patient for prescribed medication. RN will report any adverse and/or side effects to prescribing provider.  Therapeutic Interventions: 1 on 1 counseling sessions, Psychoeducation, Medication administration, Evaluate responses to treatment, Monitor vital signs and CBGs as ordered, Perform/monitor CIWA, COWS, AIMS and Fall Risk screenings as ordered, Perform wound care treatments as ordered.  Evaluation of Outcomes: Adequate for discharge    LCSW Treatment Plan for Primary Diagnosis: Major depressive disorder, recurrent, severe with psychotic features (HCC) Long Term Goal(s): Safe transition to appropriate next level of care at discharge, Engage patient in therapeutic group addressing interpersonal concerns.  Short Term Goals: Engage patient in aftercare planning with referrals and resources, Increase emotional regulation, Identify triggers associated with mental health/substance abuse issues and Increase skills for wellness and recovery  Therapeutic Interventions: Assess for all discharge needs, 1 to 1 time with Social worker, Explore available resources and support systems, Assess for adequacy in community support network, Educate family and significant other(s) on suicide prevention, Complete Psychosocial Assessment, Interpersonal group therapy.  Evaluation of Outcomes: Adequate for discharge    Progress in Treatment: Attending groups: Yes. Participating in groups: Yes. Taking medication as prescribed: Yes. Toleration medication:  Yes. Family/Significant other contact made: Yes, individual(s) contacted:  mother Patient understands diagnosis: Yes. Discussing patient identified problems/goals with staff: Yes. Medical problems stabilized or resolved: Yes. Denies suicidal/homicidal ideation: Yes. Issues/concerns per patient self-inventory: No. Other: n/a  New problem(s) identified: None identified at this time.   New Short Term/Long Term Goal(s): None identified at this time.   Discharge Plan or Barriers: Patient will discharge to his mothers house and have follow-up with [facility].  Reason for Continuation of Hospitalization: NONE  Estimated Length of Stay:0days  Attendees: Patient:Lawrence Stanton 07/26/2016 5:48 PM  Physician: Radene JourneyAndrea Hernandez, MD 07/26/2016 5:48 PM  Nursing:  07/26/2016 5:48 PM  RN Care Manager: 07/26/2016 5:48 PM  Social Worker: Marshell GarfinkelSara Mariaha Ellington,LCSW 07/26/2016 5:48 PM  Recreational Therapist:  07/26/2016 5:48 PM  Other:  07/26/2016 5:48 PM  Other:  07/26/2016 5:48 PM  Other: 07/26/2016 5:48 PM    Scribe for Treatment Team: Glennon MacSara P Murle Hellstrom, LCSW 07/26/2016 5:48 PM

## 2016-07-26 NOTE — [facility] Suicide Risk Assessment (Signed)
[facility] Discharge Suicide Risk Assessment   Principal Problem: Major depressive disorder, recurrent, severe with psychotic features [facility]) Discharge Diagnoses:  Patient Active Problem List   Diagnosis Date Noted  . Alcohol use disorder, severe, dependence (HCC) [F10.20] 07/20/2016  . GERD (gastroesophageal reflux disease) [K21.9] 07/20/2016  . HTN (hypertension) [I10] 07/20/2016  . PTSD (post-traumatic stress disorder) [F43.10] 07/20/2016  . Tobacco use disorder [F17.200] 07/20/2016  . Closed right forearm fracture [S52.91XA] 07/20/2016  . Major depressive disorder, recurrent, severe with psychotic features (HCC) [F33.3] 07/19/2016      Psychiatric Specialty Exam: ROS  Blood pressure (!) 122/91, pulse (!) 102, temperature 98 F (36.7 C), temperature source Oral, resp. rate 20, height 5\' 10"  (1.778 m), weight 78.9 kg (174 lb), SpO2 95 %.Body mass index is 24.97 kg/m.                                                        Mental Status Per Nursing Assessment::   On Admission:  Self-harm behaviors  Demographic Factors:  Male and Caucasian  Loss Factors: Legal issues  Historical Factors: Impulsivity  Risk Reduction Factors:   Sense of responsibility to family, Living with another person, especially a relative and Positive social support  No access to guns  Continued Clinical Symptoms:  Depression:   Comorbid alcohol abuse/dependence Alcohol/Substance Abuse/Dependencies Previous Psychiatric Diagnoses and Treatments  Cognitive Features That Contribute To Risk:  None    Suicide Risk:  Minimal: No identifiable suicidal ideation.  Patients presenting with no risk factors but with morbid ruminations; may be classified as minimal risk based on the severity of the depressive symptoms  Follow-up Information    [facility] Follow up on 07/28/2016.   Why:  Please arrive for your follow-up appointment on Wednesday 07/28/16 at 10:30am. If need to  reschedule please call the number listed.  Contact information: [address] [place], [place] [address] Phone: [phone] Fax: [phone]           Jimmy Footman, MD 07/26/2016, 9:02 AM

## 2016-07-26 NOTE — Plan of Care (Signed)
Problem: Coping: Goal: Ability to verbalize frustrations and anger appropriately will improve Outcome: Progressing Able to speak to GF on phone without having outbursts. Verbalizes feelings appropriately.

## 2016-07-26 NOTE — Progress Notes (Signed)
Recreation Therapy Notes  INPATIENT RECREATION TR PLAN  Patient Details Name: Lawrence Stanton. MRN: [id number] DOB: 06/10/1961 Today's Date: 07/26/2016  Rec Therapy Plan Is patient appropriate for Therapeutic Recreation?: Yes Treatment times per week: At least once a week TR Treatment/Interventions: 1:1 session, Group participation (Comment) (Appropriate participation in daily recreational therapy tx)  Discharge Criteria Pt will be discharged from therapy if:: Treatment goals are met, Discharged Treatment plan/goals/alternatives discussed and agreed upon by:: Patient/family  Discharge Summary Short term goals set: See Care Plan Short term goals met: Complete Progress toward goals comments: One-to-one attended Which groups?: Self-esteem, Leisure education, Other (Comment) (Self-expression) One-to-one attended: Stress Management, Coping Skills Reason goals not met: N/A Therapeutic equipment acquired: None Reason patient discharged from therapy: Discharge from hospital Pt/family agrees with progress & goals achieved: Yes Date patient discharged from therapy: 07/26/16   Leonette Monarch, LRT/CTRS 07/26/2016, 2:44 PM

## 2016-07-28 NOTE — Progress Notes (Signed)
  [facility] Adult Case Management Discharge Plan : Late Entry for CSW S Laws LCSW  Will you be returning to the same living situation after discharge:  No.   Will live w mother At discharge, do you have transportation home?: Yes,  family Do you have the ability to pay for your medications: No.  Referred to provider who can assist  Release of information consent forms completed and in the chart;  Patient's signature needed at discharge.  Patient to Follow up at: Follow-up Information    [facility] Follow up on 07/28/2016.   Why:  Please arrive for your follow-up appointment on Wednesday 07/28/16 at 10:30am. If need to reschedule please call the number listed.  Contact information: [address] [place], [place] [address] Phone: [phone] Fax: [phone]          Next level of care provider has access to [facility] Link:no  Safety Planning and Suicide Prevention discussed: Yes,  w mother  Have you used any form of tobacco in the last 30 days? (Cigarettes, Smokeless Tobacco, Cigars, and/or Pipes): Yes  Has patient been referred to the Quitline?: Patient refused referral  Patient has been referred for addiction treatment: Yes  Sallee Lange 07/28/2016, 5:48 PM

## 2016-10-04 ENCOUNTER — Emergency Department (HOSPITAL_COMMUNITY)
Admission: EM | Admit: 2016-10-04 | Discharge: 2016-10-05 | Disposition: A | Discharge status: Other Acute Inpatient Hospital | Payer: Self-pay | Attending: Emergency Medicine | Admitting: Emergency Medicine

## 2016-10-04 ENCOUNTER — Encounter (HOSPITAL_COMMUNITY): Payer: Self-pay | Admitting: Emergency Medicine

## 2016-10-04 DIAGNOSIS — F1010 Alcohol abuse, uncomplicated: Secondary | ICD-10-CM | POA: Insufficient documentation

## 2016-10-04 DIAGNOSIS — J449 Chronic obstructive pulmonary disease, unspecified: Secondary | ICD-10-CM | POA: Insufficient documentation

## 2016-10-04 DIAGNOSIS — I10 Essential (primary) hypertension: Secondary | ICD-10-CM | POA: Insufficient documentation

## 2016-10-04 DIAGNOSIS — G20 Parkinson's disease: Secondary | ICD-10-CM | POA: Insufficient documentation

## 2016-10-04 DIAGNOSIS — F333 Major depressive disorder, recurrent, severe with psychotic symptoms: Secondary | ICD-10-CM | POA: Insufficient documentation

## 2016-10-04 DIAGNOSIS — Z79899 Other long term (current) drug therapy: Secondary | ICD-10-CM | POA: Insufficient documentation

## 2016-10-04 DIAGNOSIS — F17210 Nicotine dependence, cigarettes, uncomplicated: Secondary | ICD-10-CM | POA: Insufficient documentation

## 2016-10-04 LAB — ETHANOL: Alcohol, Ethyl (B): 351 mg/dL (ref <5)

## 2016-10-04 LAB — RAPID URINE DRUG SCREEN, HOSP PERFORMED
AMPHETAMINES: NOT DETECTED
BARBITURATES: NOT DETECTED
Benzodiazepines: NOT DETECTED
COCAINE: NOT DETECTED
OPIATES: NOT DETECTED
TETRAHYDROCANNABINOL: NOT DETECTED

## 2016-10-04 LAB — COMPREHENSIVE METABOLIC PANEL
ALBUMIN: 4.7 g/dL (ref 3.5–5.0)
ALK PHOS: 104 U/L (ref 38–126)
ALT: 47 U/L (ref 17–63)
ANION GAP: 13 (ref 5–15)
AST: 53 U/L — ABNORMAL HIGH (ref 15–41)
BUN: 10 mg/dL (ref 6–20)
CALCIUM: 9.5 mg/dL (ref 8.9–10.3)
CHLORIDE: 107 mmol/L (ref 101–111)
CO2: 19 mmol/L — AB (ref 22–32)
CREATININE: 0.82 mg/dL (ref 0.61–1.24)
GFR calc non Af Amer: >60 mL/min (ref >60)
GLUCOSE: 115 mg/dL — AB (ref 65–99)
Potassium: 4.6 mmol/L (ref 3.5–5.1)
SODIUM: 139 mmol/L (ref 135–145)
Total Bilirubin: 0.9 mg/dL (ref 0.3–1.2)
Total Protein: 7.8 g/dL (ref 6.5–8.1)

## 2016-10-04 LAB — CBC
HEMATOCRIT: 49.9 % (ref 39.0–52.0)
HEMOGLOBIN: 17.5 g/dL — AB (ref 13.0–17.0)
MCH: 35.1 pg — ABNORMAL HIGH (ref 26.0–34.0)
MCHC: 35.1 g/dL (ref 30.0–36.0)
MCV: 100 fL (ref 78.0–100.0)
Platelets: 144 10*3/uL — ABNORMAL LOW (ref 150–400)
RBC: 4.99 MIL/uL (ref 4.22–5.81)
RDW: 14.1 % (ref 11.5–15.5)
WBC: 6.5 10*3/uL (ref 4.0–10.5)

## 2016-10-04 LAB — SALICYLATE LEVEL

## 2016-10-04 LAB — ACETAMINOPHEN LEVEL

## 2016-10-04 MED ORDER — LORAZEPAM 1 MG PO TABS
0.0000 mg | ORAL_TABLET | Freq: Four times a day (QID) | ORAL | Status: DC
  Administered 2016-10-04: 1 mg via ORAL
  Filled 2016-10-04: qty 1

## 2016-10-04 MED ORDER — LORAZEPAM 1 MG PO TABS: 0.0000 mg | ORAL_TABLET | Freq: Two times a day (BID) | ORAL | Status: DC

## 2016-10-04 MED ORDER — ACETAMINOPHEN 325 MG PO TABS: 650.0000 mg | ORAL_TABLET | ORAL | Status: DC | PRN

## 2016-10-04 MED ORDER — ONDANSETRON HCL 4 MG PO TABS: 4.0000 mg | ORAL_TABLET | Freq: Three times a day (TID) | ORAL | Status: DC | PRN

## 2016-10-04 MED ORDER — OXYCODONE-ACETAMINOPHEN 5-325 MG PO TABS
2.0000 | ORAL_TABLET | Freq: Once | ORAL | Status: AC
  Administered 2016-10-04: 2 via ORAL
  Filled 2016-10-04: qty 2

## 2016-10-04 MED ORDER — NICOTINE 21 MG/24HR TD PT24
21.0000 mg | MEDICATED_PATCH | Freq: Every day | TRANSDERMAL | Status: DC
  Administered 2016-10-05: 21 mg via TRANSDERMAL
  Filled 2016-10-04: qty 1
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
## 2016-10-04 NOTE — ED Notes (Signed)
Charge nurse and staffing notified for pt.'s sitter , purple scrubs given to pt. , security notified to wand pt.
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
## 2016-10-05 ENCOUNTER — Encounter (HOSPITAL_COMMUNITY): Payer: Self-pay | Admitting: *Deleted

## 2016-10-05 ENCOUNTER — Inpatient Hospital Stay (HOSPITAL_COMMUNITY)
Admission: EM | Admit: 2016-10-05 | Discharge: 2016-10-08 | DRG: 897 | Disposition: A | Discharge status: Home / Self Care | Payer: No Typology Code available for payment source | Source: Intra-hospital | Attending: Psychiatry | Admitting: Psychiatry

## 2016-10-05 DIAGNOSIS — F333 Major depressive disorder, recurrent, severe with psychotic symptoms: Secondary | ICD-10-CM | POA: Diagnosis present

## 2016-10-05 DIAGNOSIS — I10 Essential (primary) hypertension: Secondary | ICD-10-CM | POA: Diagnosis present

## 2016-10-05 DIAGNOSIS — F1024 Alcohol dependence with alcohol-induced mood disorder: Principal | ICD-10-CM | POA: Diagnosis present

## 2016-10-05 DIAGNOSIS — Z882 Allergy status to sulfonamides status: Secondary | ICD-10-CM | POA: Diagnosis not present

## 2016-10-05 DIAGNOSIS — R45851 Suicidal ideations: Secondary | ICD-10-CM | POA: Diagnosis present

## 2016-10-05 DIAGNOSIS — F1020 Alcohol dependence, uncomplicated: Secondary | ICD-10-CM | POA: Diagnosis present

## 2016-10-05 DIAGNOSIS — Z79899 Other long term (current) drug therapy: Secondary | ICD-10-CM | POA: Diagnosis not present

## 2016-10-05 DIAGNOSIS — Z915 Personal history of self-harm: Secondary | ICD-10-CM

## 2016-10-05 DIAGNOSIS — F1094 Alcohol use, unspecified with alcohol-induced mood disorder: Secondary | ICD-10-CM | POA: Diagnosis present

## 2016-10-05 DIAGNOSIS — F10239 Alcohol dependence with withdrawal, unspecified: Secondary | ICD-10-CM | POA: Diagnosis present

## 2016-10-05 DIAGNOSIS — Z87442 Personal history of urinary calculi: Secondary | ICD-10-CM

## 2016-10-05 DIAGNOSIS — R140 Abdominal distension (gaseous): Secondary | ICD-10-CM | POA: Diagnosis present

## 2016-10-05 DIAGNOSIS — M62838 Other muscle spasm: Secondary | ICD-10-CM | POA: Diagnosis present

## 2016-10-05 DIAGNOSIS — Z23 Encounter for immunization: Secondary | ICD-10-CM

## 2016-10-05 DIAGNOSIS — M542 Cervicalgia: Secondary | ICD-10-CM | POA: Diagnosis present

## 2016-10-05 DIAGNOSIS — R1084 Generalized abdominal pain: Secondary | ICD-10-CM | POA: Diagnosis present

## 2016-10-05 DIAGNOSIS — F17210 Nicotine dependence, cigarettes, uncomplicated: Secondary | ICD-10-CM | POA: Diagnosis present

## 2016-10-05 MED ORDER — LORAZEPAM 1 MG PO TABS
1.0000 mg | ORAL_TABLET | Freq: Three times a day (TID) | ORAL | Status: DC
  Administered 2016-10-06: 1 mg via ORAL
  Filled 2016-10-05: qty 1

## 2016-10-05 MED ORDER — NICOTINE 21 MG/24HR TD PT24
21.0000 mg | MEDICATED_PATCH | Freq: Every day | TRANSDERMAL | Status: DC
  Administered 2016-10-05 – 2016-10-06 (×2): 21 mg via TRANSDERMAL
  Filled 2016-10-05 (×3): qty 1

## 2016-10-05 MED ORDER — INFLUENZA VAC SPLIT QUAD 0.5 ML IM SUSY
0.5000 mL | PREFILLED_SYRINGE | INTRAMUSCULAR | Status: AC
  Administered 2016-10-06: 0.5 mL via INTRAMUSCULAR
  Filled 2016-10-05: qty 0.5

## 2016-10-05 MED ORDER — PNEUMOCOCCAL VAC POLYVALENT 25 MCG/0.5ML IJ INJ
0.5000 mL | INJECTION | INTRAMUSCULAR | Status: AC
  Administered 2016-10-06: 0.5 mL via INTRAMUSCULAR

## 2016-10-05 MED ORDER — GABAPENTIN 300 MG PO CAPS
300.0000 mg | ORAL_CAPSULE | Freq: Three times a day (TID) | ORAL | Status: DC
  Administered 2016-10-05: 300 mg via ORAL
  Filled 2016-10-05: qty 1

## 2016-10-05 MED ORDER — HYDROCODONE-ACETAMINOPHEN 5-325 MG PO TABS
1.0000 | ORAL_TABLET | Freq: Four times a day (QID) | ORAL | Status: DC | PRN
  Administered 2016-10-05: 1 via ORAL
  Filled 2016-10-05 (×2): qty 1

## 2016-10-05 MED ORDER — ADULT MULTIVITAMIN W/MINERALS CH
1.0000 | ORAL_TABLET | Freq: Every day | ORAL | Status: DC
  Administered 2016-10-05 – 2016-10-06 (×2): 1 via ORAL
  Filled 2016-10-05 (×3): qty 1

## 2016-10-05 MED ORDER — ONDANSETRON 4 MG PO TBDP: 4.0000 mg | ORAL_TABLET | Freq: Four times a day (QID) | ORAL | Status: DC | PRN

## 2016-10-05 MED ORDER — LORAZEPAM 1 MG PO TABS: 1.0000 mg | ORAL_TABLET | Freq: Four times a day (QID) | ORAL | Status: DC | PRN

## 2016-10-05 MED ORDER — IBUPROFEN 200 MG PO TABS
400.0000 mg | ORAL_TABLET | Freq: Four times a day (QID) | ORAL | Status: DC | PRN
  Administered 2016-10-05 – 2016-10-06 (×3): 400 mg via ORAL
  Filled 2016-10-05 (×3): qty 1

## 2016-10-05 MED ORDER — ACETAMINOPHEN 325 MG PO TABS
650.0000 mg | ORAL_TABLET | Freq: Four times a day (QID) | ORAL | Status: DC | PRN
  Administered 2016-10-06: 650 mg via ORAL
  Filled 2016-10-05: qty 2

## 2016-10-05 MED ORDER — QUETIAPINE FUMARATE 100 MG PO TABS
100.0000 mg | ORAL_TABLET | Freq: Every day | ORAL | Status: DC
  Administered 2016-10-05: 100 mg via ORAL
  Filled 2016-10-05 (×2): qty 1

## 2016-10-05 MED ORDER — RISPERIDONE 1 MG PO TABS
2.0000 mg | ORAL_TABLET | Freq: Every day | ORAL | Status: DC
  Administered 2016-10-05: 2 mg via ORAL
  Filled 2016-10-05: qty 2

## 2016-10-05 MED ORDER — LORAZEPAM 1 MG PO TABS: 1.0000 mg | ORAL_TABLET | Freq: Two times a day (BID) | ORAL | Status: DC

## 2016-10-05 MED ORDER — KETOROLAC TROMETHAMINE 15 MG/ML IJ SOLN
15.0000 mg | Freq: Once | INTRAMUSCULAR | Status: AC
  Administered 2016-10-05: 15 mg via INTRAVENOUS
  Filled 2016-10-05 (×2): qty 1

## 2016-10-05 MED ORDER — METHOCARBAMOL 500 MG PO TABS
750.0000 mg | ORAL_TABLET | Freq: Four times a day (QID) | ORAL | Status: DC | PRN
  Administered 2016-10-05 – 2016-10-06 (×3): 750 mg via ORAL
  Filled 2016-10-05 (×3): qty 1

## 2016-10-05 MED ORDER — QUETIAPINE FUMARATE 25 MG PO TABS
25.0000 mg | ORAL_TABLET | Freq: Three times a day (TID) | ORAL | Status: DC
  Administered 2016-10-05 – 2016-10-06 (×3): 25 mg via ORAL
  Filled 2016-10-05 (×6): qty 1

## 2016-10-05 MED ORDER — FAMOTIDINE 20 MG PO TABS
40.0000 mg | ORAL_TABLET | Freq: Every day | ORAL | Status: DC
  Administered 2016-10-05: 40 mg via ORAL
  Filled 2016-10-05: qty 2

## 2016-10-05 MED ORDER — ALBUTEROL SULFATE HFA 108 (90 BASE) MCG/ACT IN AERS: 1.0000 | INHALATION_SPRAY | Freq: Four times a day (QID) | RESPIRATORY_TRACT | Status: DC | PRN

## 2016-10-05 MED ORDER — VITAMIN B-1 100 MG PO TABS
100.0000 mg | ORAL_TABLET | Freq: Every day | ORAL | Status: DC
  Administered 2016-10-06: 100 mg via ORAL
  Filled 2016-10-05 (×2): qty 1

## 2016-10-05 MED ORDER — TRAZODONE HCL 50 MG PO TABS
150.0000 mg | ORAL_TABLET | Freq: Every day | ORAL | Status: DC
Start: 2016-10-05 — End: 2016-10-05
  Administered 2016-10-05: 150 mg via ORAL
  Filled 2016-10-05: qty 1

## 2016-10-05 MED ORDER — VENLAFAXINE HCL ER 75 MG PO CP24
75.0000 mg | ORAL_CAPSULE | Freq: Every day | ORAL | Status: DC
  Administered 2016-10-06: 75 mg via ORAL
  Filled 2016-10-05 (×2): qty 1

## 2016-10-05 MED ORDER — HYDROXYZINE HCL 25 MG PO TABS
25.0000 mg | ORAL_TABLET | Freq: Four times a day (QID) | ORAL | Status: DC | PRN
  Administered 2016-10-06: 25 mg via ORAL
  Filled 2016-10-05: qty 1

## 2016-10-05 MED ORDER — GABAPENTIN 300 MG PO CAPS
300.0000 mg | ORAL_CAPSULE | Freq: Three times a day (TID) | ORAL | Status: DC
  Administered 2016-10-05 – 2016-10-06 (×3): 300 mg via ORAL
  Filled 2016-10-05 (×7): qty 1

## 2016-10-05 MED ORDER — LORAZEPAM 1 MG PO TABS: 1.0000 mg | ORAL_TABLET | Freq: Every day | ORAL | Status: DC

## 2016-10-05 MED ORDER — LORAZEPAM 1 MG PO TABS
1.0000 mg | ORAL_TABLET | Freq: Four times a day (QID) | ORAL | Status: AC
  Administered 2016-10-05 (×3): 1 mg via ORAL
  Filled 2016-10-05 (×3): qty 1

## 2016-10-05 MED ORDER — FLEET ENEMA 7-19 GM/118ML RE ENEM
1.0000 | ENEMA | Freq: Once | RECTAL | Status: AC
Start: 2016-10-05 — End: 2016-10-05
  Administered 2016-10-05: 1 via RECTAL
  Filled 2016-10-05 (×2): qty 1

## 2016-10-05 MED ORDER — LOPERAMIDE HCL 2 MG PO CAPS
2.0000 mg | ORAL_CAPSULE | ORAL | Status: DC | PRN
Start: 2016-10-05 — End: 2016-10-06

## 2016-10-05 MED ORDER — THIAMINE HCL 100 MG/ML IJ SOLN
100.0000 mg | Freq: Once | INTRAMUSCULAR | Status: AC
  Administered 2016-10-05: 100 mg via INTRAMUSCULAR
  Filled 2016-10-05: qty 2

## 2016-10-05 MED ORDER — FLUTICASONE FUROATE-VILANTEROL 100-25 MCG/INH IN AEPB
1.0000 | INHALATION_SPRAY | Freq: Every day | RESPIRATORY_TRACT | Status: DC
  Administered 2016-10-05 – 2016-10-06 (×2): 1 via RESPIRATORY_TRACT
  Filled 2016-10-05: qty 28

## 2016-10-05 MED ORDER — PANTOPRAZOLE SODIUM 20 MG PO TBEC
20.0000 mg | DELAYED_RELEASE_TABLET | Freq: Every day | ORAL | Status: DC
  Administered 2016-10-05 – 2016-10-06 (×2): 20 mg via ORAL
  Filled 2016-10-05 (×3): qty 1

## 2016-10-05 NOTE — Tx Team (Signed)
Initial Treatment Plan 10/05/2016 11:30 AM Lawrence Stanton. BWG:[id number]    PATIENT STRESSORS: Financial difficulties Health problems Occupational concerns Substance abuse   PATIENT STRENGTHS: Ability for insight Average or above average intelligence Capable of independent living Communication skills Motivation for treatment/growth Physical Health Supportive family/friends   PATIENT IDENTIFIED PROBLEMS: "alcohol"  "depression"  "anxiety"  "panic attacks"  "suicidal thoughts"             DISCHARGE CRITERIA:  Ability to meet basic life and health needs Adequate post-discharge living arrangements Improved stabilization in mood, thinking, and/or behavior Medical problems require only outpatient monitoring Motivation to continue treatment in a less acute level of care Need for constant or close observation no longer present Reduction of life-threatening or endangering symptoms to within safe limits Safe-care adequate arrangements made Verbal commitment to aftercare and medication compliance Withdrawal symptoms are absent or subacute and managed without 24-hour nursing intervention  PRELIMINARY DISCHARGE PLAN: Attend aftercare/continuing care group Attend PHP/IOP Attend 12-step recovery group Outpatient therapy Return to previous living arrangement  PATIENT/FAMILY INVOLVEMENT: This treatment plan has been presented to and reviewed with the patient, Lawrence Stanton..  The patient and family have been given the opportunity to ask questions and make suggestions.  Quintella Reichert [place], RN 10/05/2016, 11:30 AM
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
## 2016-10-05 NOTE — ED Notes (Signed)
[facility] transport made aware that pt needs to be transferred. States that they should be there "in 20 to 25 minutes"

## 2016-10-05 NOTE — Progress Notes (Signed)
Recreation Therapy Notes  Animal-Assisted Activity (AAA) Program Checklist/Progress Notes Patient Eligibility Criteria Checklist & Daily Group note for Rec TxIntervention  Date: 11.28.2017 Time: 2:45pm Location: [address]    AAA/T Program Assumption of Risk Form signed by Patient/ or Parent Legal Guardian Yes  Patient is free of allergies or sever asthma Yes  Patient reports no fear of animals Yes  Patient reports no history of cruelty to animals Yes  Patient understands his/her participation is voluntary Yes  Patient washes hands before animal contact Yes  Patient washes hands after animal contact Yes  Behavioral Response: Appropriate   Education:Hand Washing, Appropriate Animal Interaction   Education Outcome: Acknowledges education.   Clinical Observations/Feedback: Patient attended session and interacted appropriately with therapy dog and peers.    Marykay Lex Lavine Hargrove, LRT/CTRS         Verlyn Dannenberg L 10/05/2016 3:07 PM

## 2016-10-05 NOTE — ED Notes (Signed)
Pt at desk making a phone call.  
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
## 2016-10-05 NOTE — Plan of Care (Signed)
Problem: Activity: Goal: Interest or engagement in leisure activities will improve Outcome: Progressing Nurse discussed depression/coping skills with pt.

## 2016-10-05 NOTE — ED Notes (Signed)
[facility]

## 2016-10-05 NOTE — ED Notes (Signed)
Notified by Lawrence Lawrence from behavioral health that he has met criteria for being admitted but his bed will not be ready until 8:30 am on 10/05/16.
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
## 2016-10-05 NOTE — Progress Notes (Signed)
Medication change note  Patient reports that he typically takes 300 mg 3 times a day of gabapentin and this will be ordered.

## 2016-10-05 NOTE — Progress Notes (Signed)
EKG completed and put in patient's chart for review.
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
## 2016-10-05 NOTE — ED Notes (Signed)
Pt eating breakfast 

## 2016-10-05 NOTE — B[facility] Group Notes (Signed)
B[facility] LCSW Group Therapy  10/05/2016 1:21 PM  Type of Therapy:  Group Therapy  Participation Level:  Active  Participation Quality:  Attentive  Affect:  Appropriate  Cognitive:  Alert and Oriented  Insight:  Improving  Engagement in Therapy:  Improving  Modes of Intervention:  Confrontation, Discussion, Education, Problem-solving, Rapport Building, Socialization and Support  Summary of Progress/Problems: MHA Speaker came to talk about his personal journey with substance abuse and addiction. The pt processed ways by which to relate to the speaker. MHA speaker provided handouts and educational information pertaining to groups and services offered by the [facility].   Kumar Falwell N Smart LCSW 10/05/2016, 1:21 PM

## 2016-10-05 NOTE — ED Notes (Signed)
Pt adamant he get pain medication for his arm and became irritated when he was told it was not due. Explained to pt his next dose of percocet isn't due until around 5:30 am.

## 2016-10-05 NOTE — Progress Notes (Signed)
Patient has complained of hardness in his abdominal area tonight after dinner.  Stated he has had BM today.  Will discuss with MD tomorrow.
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
## 2016-10-06 ENCOUNTER — Emergency Department (HOSPITAL_COMMUNITY)
Admission: EM | Admit: 2016-10-06 | Discharge: 2016-10-06 | Disposition: A | Discharge status: Home / Self Care | Payer: Self-pay | Attending: Emergency Medicine | Admitting: Emergency Medicine

## 2016-10-06 ENCOUNTER — Encounter (HOSPITAL_COMMUNITY): Payer: Self-pay | Admitting: Emergency Medicine

## 2016-10-06 ENCOUNTER — Inpatient Hospital Stay (HOSPITAL_COMMUNITY): Payer: Self-pay

## 2016-10-06 DIAGNOSIS — F1020 Alcohol dependence, uncomplicated: Secondary | ICD-10-CM | POA: Diagnosis not present

## 2016-10-06 DIAGNOSIS — F17210 Nicotine dependence, cigarettes, uncomplicated: Secondary | ICD-10-CM | POA: Insufficient documentation

## 2016-10-06 DIAGNOSIS — F1094 Alcohol use, unspecified with alcohol-induced mood disorder: Secondary | ICD-10-CM | POA: Diagnosis present

## 2016-10-06 DIAGNOSIS — R109 Unspecified abdominal pain: Secondary | ICD-10-CM | POA: Insufficient documentation

## 2016-10-06 DIAGNOSIS — J449 Chronic obstructive pulmonary disease, unspecified: Secondary | ICD-10-CM | POA: Insufficient documentation

## 2016-10-06 DIAGNOSIS — I10 Essential (primary) hypertension: Secondary | ICD-10-CM | POA: Insufficient documentation

## 2016-10-06 DIAGNOSIS — Z79899 Other long term (current) drug therapy: Secondary | ICD-10-CM | POA: Diagnosis not present

## 2016-10-06 DIAGNOSIS — Z5321 Procedure and treatment not carried out due to patient leaving prior to being seen by health care provider: Secondary | ICD-10-CM | POA: Insufficient documentation

## 2016-10-06 DIAGNOSIS — Z882 Allergy status to sulfonamides status: Secondary | ICD-10-CM | POA: Diagnosis not present

## 2016-10-06 DIAGNOSIS — G20 Parkinson's disease: Secondary | ICD-10-CM | POA: Insufficient documentation

## 2016-10-06 LAB — CBC
HEMATOCRIT: 46.2 % (ref 39.0–52.0)
HEMOGLOBIN: 15.1 g/dL (ref 13.0–17.0)
MCH: 33.7 pg (ref 26.0–34.0)
MCHC: 32.7 g/dL (ref 30.0–36.0)
MCV: 103.1 fL — AB (ref 78.0–100.0)
Platelets: 145 10*3/uL — ABNORMAL LOW (ref 150–400)
RBC: 4.48 MIL/uL (ref 4.22–5.81)
RDW: 14.2 % (ref 11.5–15.5)
WBC: 6.6 10*3/uL (ref 4.0–10.5)

## 2016-10-06 LAB — COMPREHENSIVE METABOLIC PANEL
ALBUMIN: 4.5 g/dL (ref 3.5–5.0)
ALT: 47 U/L (ref 17–63)
ANION GAP: 8 (ref 5–15)
AST: 50 U/L — AB (ref 15–41)
Alkaline Phosphatase: 112 U/L (ref 38–126)
BILIRUBIN TOTAL: 0.8 mg/dL (ref 0.3–1.2)
BUN: 21 mg/dL — ABNORMAL HIGH (ref 6–20)
CHLORIDE: 102 mmol/L (ref 101–111)
CO2: 25 mmol/L (ref 22–32)
Calcium: 9.7 mg/dL (ref 8.9–10.3)
Creatinine, Ser: 1.07 mg/dL (ref 0.61–1.24)
GFR calc Af Amer: >60 mL/min (ref >60)
GFR calc non Af Amer: >60 mL/min (ref >60)
GLUCOSE: 150 mg/dL — AB (ref 65–99)
POTASSIUM: 4.6 mmol/L (ref 3.5–5.1)
SODIUM: 135 mmol/L (ref 135–145)
TOTAL PROTEIN: 7.7 g/dL (ref 6.5–8.1)

## 2016-10-06 LAB — URINALYSIS, ROUTINE W REFLEX MICROSCOPIC
BILIRUBIN URINE: NEGATIVE
GLUCOSE, UA: NEGATIVE mg/dL
Hgb urine dipstick: NEGATIVE
Ketones, ur: NEGATIVE mg/dL
Leukocytes, UA: NEGATIVE
NITRITE: NEGATIVE
PH: 6.5 (ref 5.0–8.0)
Protein, ur: NEGATIVE mg/dL
SPECIFIC GRAVITY, URINE: 1.029 (ref 1.005–1.030)

## 2016-10-06 LAB — LIPASE, BLOOD: LIPASE: 35 U/L (ref 11–51)

## 2016-10-06 LAB — TSH: TSH: 2.31 u[IU]/mL (ref 0.350–4.500)

## 2016-10-06 LAB — VITAMIN B12: VITAMIN B 12: 301 pg/mL (ref 180–914)

## 2016-10-06 MED ORDER — ACETAMINOPHEN 325 MG PO TABS
650.0000 mg | ORAL_TABLET | Freq: Four times a day (QID) | ORAL | Status: DC | PRN
  Administered 2016-10-06 – 2016-10-07 (×2): 650 mg via ORAL
  Filled 2016-10-06 (×2): qty 2

## 2016-10-06 MED ORDER — LORAZEPAM 1 MG PO TABS
1.0000 mg | ORAL_TABLET | Freq: Two times a day (BID) | ORAL | Status: DC
  Administered 2016-10-07 – 2016-10-08 (×3): 1 mg via ORAL
  Filled 2016-10-06 (×2): qty 1

## 2016-10-06 MED ORDER — GABAPENTIN 300 MG PO CAPS
300.0000 mg | ORAL_CAPSULE | Freq: Once | ORAL | Status: AC
  Administered 2016-10-06: 300 mg via ORAL
  Filled 2016-10-06: qty 1

## 2016-10-06 MED ORDER — KETOROLAC TROMETHAMINE 60 MG/2ML IM SOLN
60.0000 mg | Freq: Two times a day (BID) | INTRAMUSCULAR | Status: DC | PRN
  Administered 2016-10-06: 60 mg via INTRAMUSCULAR
  Filled 2016-10-06: qty 2

## 2016-10-06 MED ORDER — FLUTICASONE FUROATE-VILANTEROL 100-25 MCG/INH IN AEPB
1.0000 | INHALATION_SPRAY | Freq: Every day | RESPIRATORY_TRACT | Status: DC
  Administered 2016-10-07 – 2016-10-08 (×2): 1 via RESPIRATORY_TRACT
  Filled 2016-10-06: qty 28

## 2016-10-06 MED ORDER — IBUPROFEN 600 MG PO TABS
600.0000 mg | ORAL_TABLET | Freq: Four times a day (QID) | ORAL | Status: DC | PRN
  Administered 2016-10-06: 600 mg via ORAL
  Filled 2016-10-06: qty 1

## 2016-10-06 MED ORDER — IOPAMIDOL (ISOVUE-300) INJECTION 61%
INTRAVENOUS | Status: AC
Start: 2016-10-06 — End: 2016-10-07
  Filled 2016-10-06: qty 100

## 2016-10-06 MED ORDER — SODIUM CHLORIDE 0.9 % IJ SOLN
INTRAMUSCULAR | Status: AC
Start: 2016-10-06 — End: 2016-10-07
  Filled 2016-10-06: qty 50

## 2016-10-06 MED ORDER — IOPAMIDOL (ISOVUE-300) INJECTION 61%
100.0000 mL | Freq: Once | INTRAVENOUS | Status: AC | PRN
  Administered 2016-10-06: 100 mL via INTRAVENOUS

## 2016-10-06 MED ORDER — LORAZEPAM 1 MG PO TABS
1.0000 mg | ORAL_TABLET | Freq: Once | ORAL | Status: AC
  Administered 2016-10-06: 1 mg via ORAL
  Filled 2016-10-06: qty 1

## 2016-10-06 MED ORDER — VENLAFAXINE HCL ER 75 MG PO CP24
75.0000 mg | ORAL_CAPSULE | Freq: Every day | ORAL | Status: DC
  Administered 2016-10-07 – 2016-10-08 (×2): 75 mg via ORAL
  Filled 2016-10-06 (×2): qty 1
  Filled 2016-10-06 (×2): qty 7
  Filled 2016-10-06 (×2): qty 1

## 2016-10-06 MED ORDER — LORAZEPAM 2 MG/ML IJ SOLN
0.5000 mg | Freq: Once | INTRAMUSCULAR | Status: AC
  Administered 2016-10-06: 0.5 mg via INTRAVENOUS
  Filled 2016-10-06: qty 1

## 2016-10-06 MED ORDER — KETOROLAC TROMETHAMINE 30 MG/ML IJ SOLN
15.0000 mg | Freq: Two times a day (BID) | INTRAMUSCULAR | Status: DC
Start: 2016-10-06 — End: 2016-10-07
  Administered 2016-10-06: 15 mg via INTRAMUSCULAR
  Filled 2016-10-06 (×5): qty 1

## 2016-10-06 MED ORDER — NICOTINE 21 MG/24HR TD PT24
21.0000 mg | MEDICATED_PATCH | Freq: Every day | TRANSDERMAL | Status: DC
  Administered 2016-10-07 – 2016-10-08 (×2): 21 mg via TRANSDERMAL
  Filled 2016-10-06 (×4): qty 1

## 2016-10-06 MED ORDER — QUETIAPINE FUMARATE 100 MG PO TABS
100.0000 mg | ORAL_TABLET | Freq: Every evening | ORAL | Status: DC | PRN
  Administered 2016-10-06: 100 mg via ORAL
  Filled 2016-10-06 (×4): qty 1

## 2016-10-06 MED ORDER — METHOCARBAMOL 750 MG PO TABS
750.0000 mg | ORAL_TABLET | Freq: Four times a day (QID) | ORAL | Status: DC | PRN
  Administered 2016-10-06 – 2016-10-07 (×2): 750 mg via ORAL
  Filled 2016-10-06 (×2): qty 1

## 2016-10-06 MED ORDER — ADULT MULTIVITAMIN W/MINERALS CH
1.0000 | ORAL_TABLET | Freq: Every day | ORAL | Status: DC
  Administered 2016-10-07 – 2016-10-08 (×2): 1 via ORAL
  Filled 2016-10-06 (×4): qty 1

## 2016-10-06 MED ORDER — LORAZEPAM 1 MG PO TABS
1.0000 mg | ORAL_TABLET | Freq: Once | ORAL | Status: AC
  Administered 2016-10-08: 1 mg via ORAL
  Filled 2016-10-06: qty 1

## 2016-10-06 MED ORDER — SIMETHICONE 80 MG PO CHEW: 80.0000 mg | CHEWABLE_TABLET | Freq: Four times a day (QID) | ORAL | 0 refills | Status: AC | PRN

## 2016-10-06 MED ORDER — VITAMIN B-1 100 MG PO TABS
100.0000 mg | ORAL_TABLET | Freq: Every day | ORAL | Status: DC
  Administered 2016-10-07 – 2016-10-08 (×2): 100 mg via ORAL
  Filled 2016-10-06 (×4): qty 1

## 2016-10-06 MED ORDER — KETOROLAC TROMETHAMINE 30 MG/ML IJ SOLN
30.0000 mg | Freq: Once | INTRAMUSCULAR | Status: AC
  Administered 2016-10-06: 30 mg via INTRAVENOUS
  Filled 2016-10-06: qty 1

## 2016-10-06 MED ORDER — MAGNESIUM HYDROXIDE 400 MG/5ML PO SUSP: 30.0000 mL | Freq: Every day | ORAL | Status: DC | PRN

## 2016-10-06 MED ORDER — PANTOPRAZOLE SODIUM 20 MG PO TBEC
20.0000 mg | DELAYED_RELEASE_TABLET | Freq: Every day | ORAL | Status: DC
  Administered 2016-10-07 – 2016-10-08 (×2): 20 mg via ORAL
  Filled 2016-10-06 (×4): qty 1

## 2016-10-06 MED ORDER — DICYCLOMINE HCL 20 MG PO TABS: 20.0000 mg | ORAL_TABLET | Freq: Two times a day (BID) | ORAL | 0 refills | Status: AC | PRN

## 2016-10-06 MED ORDER — GABAPENTIN 300 MG PO CAPS
300.0000 mg | ORAL_CAPSULE | Freq: Three times a day (TID) | ORAL | Status: DC
  Administered 2016-10-07 – 2016-10-08 (×5): 300 mg via ORAL
  Filled 2016-10-06 (×2): qty 1
  Filled 2016-10-06 (×4): qty 21
  Filled 2016-10-06 (×4): qty 1
  Filled 2016-10-06: qty 21
  Filled 2016-10-06: qty 1
  Filled 2016-10-06: qty 21

## 2016-10-06 MED ORDER — DOCUSATE SODIUM 100 MG PO CAPS: 100.0000 mg | ORAL_CAPSULE | Freq: Every day | ORAL | Status: DC | PRN

## 2016-10-06 MED ORDER — ALUM & MAG HYDROXIDE-SIMETH 200-200-20 MG/5ML PO SUSP: 30.0000 mL | ORAL | Status: DC | PRN

## 2016-10-06 MED ORDER — HYDROXYZINE HCL 25 MG PO TABS
25.0000 mg | ORAL_TABLET | Freq: Four times a day (QID) | ORAL | Status: DC | PRN
  Administered 2016-10-07 – 2016-10-08 (×2): 25 mg via ORAL
  Filled 2016-10-06 (×2): qty 1
  Filled 2016-10-06: qty 10

## 2016-10-06 NOTE — ED Notes (Signed)
[facility] patient in room [facility]

## 2016-10-06 NOTE — ED Notes (Signed)
[facility] and notified them that he would like them to save him some dinner.  Will give pt a sandwich here also.  Await arrival of [facility]

## 2016-10-06 NOTE — Progress Notes (Signed)
[facility] MD Progress Note  10/06/2016 8:22 AM  Patient Active Problem List   Diagnosis Date Noted  . Alcohol use disorder, severe, dependence (HCC) 07/20/2016  . GERD (gastroesophageal reflux disease) 07/20/2016  . HTN (hypertension) 07/20/2016  . PTSD (post-traumatic stress disorder) 07/20/2016  . Tobacco use disorder 07/20/2016  . Closed right forearm fracture 07/20/2016  . Major depressive disorder, recurrent, severe with psychotic features (HCC) 07/19/2016    Diagnosis:Alcohol use disorder, severe with withdrawal uncomplicated  Subjective: Patient denies current suicidal or homicidal ideation, plan or intent. He received a shot of Toradol last night and says it did help some with his neck and arm pain and he would like to continue this when necessary. Chief complaint today however is that he has noted that his abdomen is becoming hard and distended. Patient reports that he is having some formed stools. He states there is some mild subdiaphragmatic pain but nothing severe.  Objective: Well-developed well-nourished man in no acute distress pleasant and appropriate mood is all right affect is mildly anxious thought processes are linear and goal-directed thought content denies any current suicidal or homicidal ideation plan or intent examination of abdomen shows visible distention since yesterday and abdomen is firm to the touch but not tender alert and oriented 3 insight and judgment are fair IQ appears an average range       Current Facility-Administered Medications (Respiratory):  .  fluticasone furoate-vilanterol (BREO ELLIPTA) 100-25 MCG/INH 1 puff   Current Facility-Administered Medications (Analgesics):  .  acetaminophen (TYLENOL) tablet 650 mg .  ibuprofen (ADVIL,MOTRIN) tablet 400 mg .  ketorolac (TORADOL) injection 60 mg     Current Facility-Administered Medications (Other):  .  gabapentin (NEURONTIN) capsule 300 mg .  hydrOXYzine (ATARAX/VISTARIL) tablet 25 mg .   Influenza vac split quadrivalent PF (FLUARIX) injection 0.5 mL .  loperamide (IMODIUM) capsule 2-4 mg .  LORazepam (ATIVAN) tablet 1 mg .  [COMPLETED] LORazepam (ATIVAN) tablet 1 mg **FOLLOWED BY** LORazepam (ATIVAN) tablet 1 mg **FOLLOWED BY** [START ON 10/07/2016] LORazepam (ATIVAN) tablet 1 mg **FOLLOWED BY** [START ON 10/08/2016] LORazepam (ATIVAN) tablet 1 mg .  methocarbamol (ROBAXIN) tablet 750 mg .  multivitamin with minerals tablet 1 tablet .  nicotine (NICODERM CQ - dosed in mg/24 hours) patch 21 mg .  ondansetron (ZOFRAN-ODT) disintegrating tablet 4 mg .  pantoprazole (PROTONIX) EC tablet 20 mg .  pneumococcal 23 valent vaccine (PNU-IMMUNE) injection 0.5 mL .  QUEtiapine (SEROQUEL) tablet 100 mg .  QUEtiapine (SEROQUEL) tablet 25 mg .  thiamine (VITAMIN B-1) tablet 100 mg .  venlafaxine XR (EFFEXOR-XR) 24 hr capsule 75 mg  No current outpatient prescriptions on file.  Vital Signs:Blood pressure 115/85, pulse (!) 105, temperature 97.9 F (36.6 C), temperature source Oral, resp. rate 16, height 5\' 9"  (1.753 m), weight 80.7 kg (178 lb), SpO2 100 %.    Lab Results:  Results for orders placed or performed during the hospital encounter of 10/05/16 (from the past 48 hour(s))  TSH     Status: None   Collection Time: 10/06/16  6:18 AM  Result Value Ref Range   TSH 2.310 0.350 - 4.500 uIU/mL    Comment: Performed by a 3rd Generation assay with a functional sensitivity of <=0.01 uIU/mL. Performed at [facility]     Physical Findings: AIMS: Facial and Oral Movements Muscles of Facial Expression: None, normal Lips and Perioral Area: None, normal Jaw: None, normal Tongue: None, normal,Extremity Movements Upper (arms, wrists, hands, fingers): None, normal Lower (  legs, knees, ankles, toes): None, normal, Trunk Movements Neck, shoulders, hips: None, normal, Overall Severity Severity of abnormal movements (highest score from questions above): None,  normal Incapacitation due to abnormal movements: None, normal Patient's awareness of abnormal movements (rate only patient's report): No Awareness, Dental Status Current problems with teeth and/or dentures?: No Does patient usually wear dentures?: No  CIWA:  CIWA-Ar Total: 4 COWS:  COWS Total Score: 7   Assessment/Plan: Patient whose initial admission labs appeared to be stable but does relate a history of prior liver failure now presents with distention of the abdomen visibly increased since yesterday. We will send patient to ER for further evaluation today in order to facilitate resolution of this problem. We will order toward all IM 16 mg every 12 hours when necessary for 5 days for pain. At present we will continue other medications unchanged and told patient is further evaluated.  Acquanetta SitElizabeth Woods Chynna Buerkle, MD 10/06/2016, 8:22 AM

## 2016-10-06 NOTE — ED Triage Notes (Signed)
Pt reports abd pain and bloating x 3 days. Last BM today, sts normal BM. denies nausea nor emesis.

## 2016-10-06 NOTE — ED Notes (Signed)
Pt notified that [facility] will transport him back and they will be here within and hour.  Pt concerned about dinner, offered sandwich and will call [facility] to see if they can hold food for him

## 2016-10-06 NOTE — ED Triage Notes (Signed)
Pt reports abdominal pain, sts abdomen bloating x 3 days. Denies nausea and emesis. sts last BM today and was normal. Pt sent from [facility] with multiple complaints.

## 2016-10-06 NOTE — Progress Notes (Signed)
Patient ID: Lawrence Filbert., male   DOB: November 06, 1961, 55 y.o.   MRN: [id number]  Lawrence Stanton transportation contacted and are en route. Patient and MD Mckinley Jewel notified. MD Mckinley Jewel notified that patient's BP and pulse are elevated as well.

## 2016-10-06 NOTE — Progress Notes (Signed)
Patient ID: Lawrence FilbertJames Kenneth Wempe Jr., male   DOB: 07/12/1961, 55 y.o.   MRN: [id number] D: Client visible on the unit, seen in dayroom interacting with peers and playing cards. Client complains of pain with no relief with Ibuprofen and Robaxin. A: Upon client speaking with Dianna LimboS. Simon, PA new orders received for Toradol 15 mg x 1 for pain. Staff will monitor q9715min for safety. R:Client is safe on the unit, attended group.

## 2016-10-06 NOTE — ED Provider Notes (Signed)
[facility]-EMERGENCY DEPT Provider Note   CSN: [id number] Arrival date & time: 10/06/16  1132     History   Chief Complaint Chief Complaint  Patient presents with  . Abdominal Pain    HPI Lawrence Stanton. is a 55 y.o. male.  Patient with history of diverticulitis, pancreatitis, currently at [facility] 2/2 alcohol use and SI -- presents with c/o abdominal pain that is generalized for 3 days. Also c/o bloating sensation and abdominal distention. No fever, vomiting, constipation, or diarrhea. No CP or SOB. No urinary sx. Currently on tylenol and ibuprofen. The onset of this condition was acute. The course is constant. Aggravating factors: none. Alleviating factors: none.        Past Medical History:  Diagnosis Date  . Anxiety   . Bipolar disorder (HCC)   . Colitis   . COPD (chronic obstructive pulmonary disease) (HCC)   . Depression   . Diverticulitis   . Epilepsy (HCC)   . GERD (gastroesophageal reflux disease)   . Hypertension   . Kidney stones   . Metabolic encephalopathy   . Migraine   . Multiple sclerosis (HCC)   . Myasthenia gravis (HCC)   . Pancreatitis   . Parkinson's disease [facility])     Patient Active Problem List   Diagnosis Date Noted  . Alcohol use disorder, severe, dependence (HCC) 07/20/2016  . GERD (gastroesophageal reflux disease) 07/20/2016  . HTN (hypertension) 07/20/2016  . PTSD (post-traumatic stress disorder) 07/20/2016  . Tobacco use disorder 07/20/2016  . Closed right forearm fracture 07/20/2016  . Major depressive disorder, recurrent, severe with psychotic features (HCC) 07/19/2016    Past Surgical History:  Procedure Laterality Date  . APPENDECTOMY         Home Medications    Prior to Admission medications   Medication Sig Start Date End Date Taking? Authorizing Provider  albuterol (PROVENTIL HFA;VENTOLIN HFA) 108 (90 Base) MCG/ACT inhaler Inhale 1-2 puffs into the lungs every 6 (six) hours as needed for wheezing or shortness of  breath.   Yes Historical Provider, MD  fluticasone furoate-vilanterol (BREO ELLIPTA) 100-25 MCG/INH AEPB Inhale 1 puff into the lungs daily.   Yes Historical Provider, MD  gabapentin (NEURONTIN) 300 MG capsule Take 1 capsule (300 mg total) by mouth 3 (three) times daily. 07/26/16  Yes Jimmy Footman, MD  HYDROcodone-acetaminophen (NORCO/VICODIN) 5-325 MG tablet Take 1 tablet by mouth every 6 (six) hours as needed for moderate pain.   Yes Historical Provider, MD  risperiDONE (RISPERDAL) 2 MG tablet Take 1 tablet (2 mg total) by mouth at bedtime. 07/26/16  Yes Jimmy Footman, MD  umeclidinium bromide (INCRUSE ELLIPTA) 62.5 MCG/INH AEPB Inhale 1 puff into the lungs daily.   Yes Historical Provider, MD  famotidine (PEPCID) 40 MG tablet Take 40 mg by mouth at bedtime.    Historical Provider, MD  traZODone (DESYREL) 150 MG tablet Take 1 tablet (150 mg total) by mouth at bedtime. 07/26/16   Jimmy Footman, MD  venlafaxine XR (EFFEXOR-XR) 150 MG 24 hr capsule Take 1 capsule (150 mg total) by mouth daily with breakfast. 07/27/16   Jimmy Footman, MD    Family History History reviewed. No pertinent family history.  Social History Social History  Substance Use Topics  . Smoking status: Current Every Day Smoker    Packs/day: 1.00    Years: 15.00    Types: Cigarettes  . Smokeless tobacco: Never Used  . Alcohol use 7.2 oz/week    6 Cans of beer, 6 Shots of liquor  per week     Allergies   Sulfa antibiotics   Review of Systems Review of Systems  Constitutional: Negative for fever.  HENT: Negative for rhinorrhea and sore throat.   Eyes: Negative for redness.  Respiratory: Negative for cough.   Cardiovascular: Negative for chest pain.  Gastrointestinal: Positive for abdominal distention and abdominal pain. Negative for diarrhea, nausea and vomiting.  Genitourinary: Negative for dysuria.  Musculoskeletal: Negative for myalgias.  Skin: Negative for rash.    Neurological: Negative for headaches.     Physical Exam Updated Vital Signs BP 139/99 (BP Location: Left Arm)   Pulse 84   Temp 98.7 F (37.1 C) (Oral)   Resp 16   Ht 5\' 9"  (1.753 m)   Wt 80.7 kg   SpO2 97%   BMI 26.29 kg/m   Physical Exam  Constitutional: He appears well-developed and well-nourished.  HENT:  Head: Normocephalic and atraumatic.  Mouth/Throat: Oropharynx is clear and moist.  Eyes: Conjunctivae are normal. Right eye exhibits no discharge. Left eye exhibits no discharge.  Neck: Normal range of motion. Neck supple.  Cardiovascular: Normal rate, regular rhythm and normal heart sounds.   Pulmonary/Chest: Effort normal and breath sounds normal.  Abdominal: Soft. He exhibits distension (mild). He exhibits no mass. There is tenderness. There is no rebound and no guarding.  Neurological: He is alert.  Skin: Skin is warm and dry.  Psychiatric: He has a normal mood and affect.  Nursing note and vitals reviewed.    ED Treatments / Results  Labs (all labs ordered are listed, but only abnormal results are displayed) Labs Reviewed  COMPREHENSIVE METABOLIC PANEL - Abnormal; Notable for the following:       Result Value   Glucose, Bld 150 (*)    BUN 21 (*)    AST 50 (*)    All other components within normal limits  CBC - Abnormal; Notable for the following:    MCV 103.1 (*)    Platelets 145 (*)    All other components within normal limits  TSH  VITAMIN B12  LIPASE, BLOOD  URINALYSIS, ROUTINE W REFLEX MICROSCOPIC (NOT AT [facility])    Radiology Ct Abdomen Pelvis W Contrast  Result Date: 10/06/2016 CLINICAL DATA:  Abdominal pain and bloating for several days EXAM: CT ABDOMEN AND PELVIS WITH CONTRAST TECHNIQUE: Multidetector CT imaging of the abdomen and pelvis was performed using the standard protocol following bolus administration of intravenous contrast. CONTRAST:  100mL ISOVUE-300 IOPAMIDOL (ISOVUE-300) INJECTION 61% COMPARISON:  07/17/2016 FINDINGS: Lower  chest: No acute abnormality. Hepatobiliary: The liver is diffusely fatty infiltrated. No focal mass lesion is noted. Gallbladder is within normal limits. Pancreas: Unremarkable. No pancreatic ductal dilatation or surrounding inflammatory changes. Spleen: Normal in size without focal abnormality. Adrenals/Urinary Tract: The adrenal glands are within normal limits. The left kidney demonstrates some mild scarring as well as a tiny nonobstructing upper pole stone. No obstructive changes are identified. The ureters are within normal limits. The bladder is decompressed. Stomach/Bowel: Scattered diverticular change of the colon is noted without evidence of diverticulitis. No obstructive changes are seen. The appendix has been surgically removed. Vascular/Lymphatic: Aortic atherosclerosis. No enlarged abdominal or pelvic lymph nodes. Reproductive: Prostate is unremarkable. Other: No abdominal wall hernia or abnormality. No abdominopelvic ascites. Musculoskeletal: No acute or significant osseous findings. IMPRESSION: Diverticulosis without diverticulitis. Nonobstructing left renal stone. No acute abnormality is seen. Electronically Signed   By: Alcide CleverMark  Lukens M.D.   On: 10/06/2016 16:52    Procedures Procedures (including critical  care time)  Medications Ordered in ED Medications  sodium chloride 0.9 % injection (  Not Given 10/06/16 1715)  pneumococcal 23 valent vaccine (PNU-IMMUNE) injection 0.5 mL (0.5 mLs Intramuscular Given 10/06/16 1024)  thiamine (B-1) injection 100 mg (100 mg Intramuscular Given 10/05/16 1156)  LORazepam (ATIVAN) tablet 1 mg (1 mg Oral Given 10/05/16 2200)  Influenza vac split quadrivalent PF (FLUARIX) injection 0.5 mL (0.5 mLs Intramuscular Given 10/06/16 1025)  ketorolac (TORADOL) 15 MG/ML injection 15 mg (15 mg Intravenous Given 10/05/16 2301)  sodium phosphate (FLEET) 7-19 GM/118ML enema 1 enema (1 enema Rectal Given 10/05/16 2303)  iopamidol (ISOVUE-300) 61 % injection 100 mL (100  mLs Intravenous Contrast Given 10/06/16 1630)  ketorolac (TORADOL) 30 MG/ML injection 30 mg (30 mg Intravenous Given 10/06/16 1715)  LORazepam (ATIVAN) injection 0.5 mg (0.5 mg Intravenous Given 10/06/16 1715)     Initial Impression / Assessment and Plan / ED Course  I have reviewed the triage vital signs and the nursing notes.  Pertinent labs & imaging results that were available during my care of the patient were reviewed by me and considered in my medical decision making (see chart for details).  Clinical Course    Patient seen and examined. Work-up initiated. Medications ordered. Reviewed med list at [facility] (IV toradol and ativan given). Will obtain CT to r/o diverticulitis, other emergent etiologies.   Vital signs reviewed and are as follows: BP (!) 135/101 (BP Location: Left Arm)   Pulse 78   Temp 98.7 F (37.1 C) (Oral)   Resp 18   Ht 5\' 9"  (1.753 m)   Wt 80.7 kg   SpO2 100%   BMI 26.29 kg/m    5:16 PM CT reviewed. No acute findings. Will give bentyl, simethicone to use prn and have patient continue pepcid.    Patient urged to return with worsening symptoms or other concerns. Patient verbalized understanding and agrees with plan.   The patient was urged to return to the Emergency Department immediately with worsening of current symptoms, worsening abdominal pain, persistent vomiting, blood noted in stools, fever, or any other concerns. The patient verbalized understanding.   Final Clinical Impressions(s) / ED Diagnoses   Final diagnoses:  Generalized abdominal pain   Patient with abdominal pain. Vitals are stable, no fever. Labs are reassuring. Imaging without acute process. No signs of dehydration, patient is tolerating PO's. Lungs are clear and no signs suggestive of PNA. Low concern for appendicitis, cholecystitis, pancreatitis, ruptured viscus, UTI, kidney stone, aortic dissection, aortic aneurysm or other emergent abdominal etiology. Supportive therapy indicated with  return if symptoms worsen.    New Prescriptions New Prescriptions   DICYCLOMINE (BENTYL) 20 MG TABLET    Take 1 tablet (20 mg total) by mouth 2 (two) times daily as needed for spasms.   SIMETHICONE (GAS-X) 80 MG CHEWABLE TABLET    Chew 1 tablet (80 mg total) by mouth every 6 (six) hours as needed for flatulence.     Renne Crigler, PA-C 10/06/16 1741    Benjiman Core, MD 10/06/16 [phone]

## 2016-10-06 NOTE — Progress Notes (Signed)
Pt attended the evening NA speaker meeting. Pt was engaged and appropriate. Alantra Popoca C, NT 10/06/16 11:42 PM  

## 2016-10-06 NOTE — Discharge Instructions (Signed)
Please read and follow all provided instructions.  Your diagnoses today include:  1. Generalized abdominal pain     Tests performed today include:  Blood counts and electrolytes  Blood tests to check liver and kidney function  Blood tests to check pancreas function  Urine test to look for infection  CT scan - no acute infections, blockages, or other problems  Vital signs. See below for your results today.   Medications prescribed:   Bentyl - medication for intestinal cramps and spasms  Take any prescribed medications only as directed.  Home care instructions:   Follow any educational materials contained in this packet.  Follow-up instructions: Please follow-up with your primary care provider in the next 7 days for further evaluation of your symptoms.    Return instructions:  SEEK IMMEDIATE MEDICAL ATTENTION IF:  The pain does not go away or becomes severe   A temperature above 101F develops   Repeated vomiting occurs (multiple episodes)   The pain becomes localized to portions of the abdomen. The right side could possibly be appendicitis. In an adult, the left lower portion of the abdomen could be colitis or diverticulitis.   Blood is being passed in stools or vomit (bright red or black tarry stools)   You develop chest pain, difficulty breathing, dizziness or fainting, or become confused, poorly responsive, or inconsolable (young children)  If you have any other emergent concerns regarding your health  Additional Information: Abdominal (belly) pain can be caused by many things. Your caregiver performed an examination and possibly ordered blood/urine tests and imaging (CT scan, x-rays, ultrasound). Many cases can be observed and treated at home after initial evaluation in the emergency department. Even though you are being discharged home, abdominal pain can be unpredictable. Therefore, you need a repeated exam if your pain does not resolve, returns, or worsens.  Most patients with abdominal pain don't have to be admitted to the hospital or have surgery, but serious problems like appendicitis and gallbladder attacks can start out as nonspecific pain. Many abdominal conditions cannot be diagnosed in one visit, so follow-up evaluations are very important.  Your vital signs today were: BP 148/97    Pulse 76    Temp 98.7 F (37.1 C) (Oral)    Resp 18    Ht 5\' 9"  (1.753 m)    Wt 80.7 kg    SpO2 99%    BMI 26.29 kg/m  If your blood pressure (bp) was elevated above 135/85 this visit, please have this repeated by your doctor within one month. --------------

## 2016-10-06 NOTE — Progress Notes (Signed)
Patient ID: Lawrence Stanton., male   DOB: [birth year], 55 y.o.   MRN: [id number]  Charge RN Jan at [facility] and [facility] ED charge RN Johnny Bridge notified that patient will be going to ED due to abdominal distention and firmness.

## 2016-10-06 NOTE — Progress Notes (Signed)
Patient ID: Lawrence FilbertJames Kenneth Reist Jr., male   DOB: 12/04/1960, 55 y.o.   MRN: [id number]  DAR: Pt. Denies SI/HI and A/V Hallucinations and reports he is able to contract for safety if he feels unsafe. His BP and pulse is elevated at this time. Patient states, "I know it's because I'm in pain." He is asymptomatic at this time. Patient reports sleep is good, appetite is fair, energy level is low, and concentration is poor. He rates depression 9/10, hopelessness 8/10, and anxiety 8/10. Support and encouragement provided to the patient. Scheduled medications administered to patient as well as PRN medications for pain/ He also received Influenza and Pneumococcal vaccines today. Patient is minimal with Clinical research associatewriter but cooperative. He is polite during interaction. He continues to utilize his right wrist/arm brace. Q15 minute checks are maintained for safety.

## 2016-10-07 DIAGNOSIS — Z79899 Other long term (current) drug therapy: Secondary | ICD-10-CM | POA: Diagnosis not present

## 2016-10-07 DIAGNOSIS — F1020 Alcohol dependence, uncomplicated: Secondary | ICD-10-CM | POA: Diagnosis not present

## 2016-10-07 MED ORDER — VENLAFAXINE HCL ER 75 MG PO CP24: 75.0000 mg | ORAL_CAPSULE | Freq: Every day | ORAL | 0 refills | Status: AC

## 2016-10-07 MED ORDER — PANTOPRAZOLE SODIUM 20 MG PO TBEC: 20.0000 mg | DELAYED_RELEASE_TABLET | Freq: Every day | ORAL | 0 refills | Status: AC

## 2016-10-07 MED ORDER — QUETIAPINE FUMARATE 25 MG PO TABS
25.0000 mg | ORAL_TABLET | Freq: Three times a day (TID) | ORAL | Status: DC
  Administered 2016-10-07 – 2016-10-08 (×4): 25 mg via ORAL
  Filled 2016-10-07 (×3): qty 21
  Filled 2016-10-07 (×3): qty 1
  Filled 2016-10-07 (×2): qty 21
  Filled 2016-10-07: qty 1
  Filled 2016-10-07: qty 21
  Filled 2016-10-07 (×2): qty 1

## 2016-10-07 MED ORDER — HYDROXYZINE HCL 25 MG PO TABS: 25.0000 mg | ORAL_TABLET | Freq: Four times a day (QID) | ORAL | 0 refills | Status: AC | PRN

## 2016-10-07 MED ORDER — QUETIAPINE FUMARATE 50 MG PO TABS: 150.0000 mg | ORAL_TABLET | Freq: Every day | ORAL | 0 refills | Status: AC

## 2016-10-07 MED ORDER — KETOROLAC TROMETHAMINE 15 MG/ML IJ SOLN
15.0000 mg | Freq: Two times a day (BID) | INTRAMUSCULAR | Status: DC
Start: 2016-10-07 — End: 2016-10-07
  Administered 2016-10-07: 15 mg via INTRAMUSCULAR
  Filled 2016-10-07 (×3): qty 1

## 2016-10-07 MED ORDER — IBUPROFEN 800 MG PO TABS
800.0000 mg | ORAL_TABLET | Freq: Four times a day (QID) | ORAL | Status: DC | PRN
  Administered 2016-10-07: 800 mg via ORAL
  Filled 2016-10-07: qty 1

## 2016-10-07 MED ORDER — QUETIAPINE FUMARATE 50 MG PO TABS
150.0000 mg | ORAL_TABLET | Freq: Every day | ORAL | Status: DC
  Administered 2016-10-07: 150 mg via ORAL
  Filled 2016-10-07 (×2): qty 21
  Filled 2016-10-07 (×2): qty 3

## 2016-10-07 MED ORDER — PRAZOSIN HCL 2 MG PO CAPS
2.0000 mg | ORAL_CAPSULE | Freq: Every day | ORAL | Status: DC
  Administered 2016-10-07: 2 mg via ORAL
  Filled 2016-10-07: qty 1
  Filled 2016-10-07: qty 7
  Filled 2016-10-07: qty 1
  Filled 2016-10-07: qty 7

## 2016-10-07 MED ORDER — GABAPENTIN 300 MG PO CAPS: 300.0000 mg | ORAL_CAPSULE | Freq: Three times a day (TID) | ORAL | 0 refills | Status: AC

## 2016-10-07 MED ORDER — QUETIAPINE FUMARATE 25 MG PO TABS: 25.0000 mg | ORAL_TABLET | Freq: Three times a day (TID) | ORAL | 0 refills | Status: AC

## 2016-10-07 MED ORDER — NICOTINE 21 MG/24HR TD PT24: 21.0000 mg | MEDICATED_PATCH | Freq: Every day | TRANSDERMAL | 0 refills | Status: AC

## 2016-10-07 MED ORDER — CYCLOBENZAPRINE HCL 10 MG PO TABS
10.0000 mg | ORAL_TABLET | Freq: Three times a day (TID) | ORAL | Status: DC | PRN
  Administered 2016-10-07 – 2016-10-08 (×3): 10 mg via ORAL
  Filled 2016-10-07 (×3): qty 1

## 2016-10-07 MED ORDER — KETOROLAC TROMETHAMINE 30 MG/ML IJ SOLN
30.0000 mg | Freq: Three times a day (TID) | INTRAMUSCULAR | Status: DC | PRN
  Administered 2016-10-07 – 2016-10-08 (×3): 30 mg via INTRAMUSCULAR
  Filled 2016-10-07 (×3): qty 1

## 2016-10-07 MED ORDER — PRAZOSIN HCL 2 MG PO CAPS: 2.0000 mg | ORAL_CAPSULE | Freq: Every day | ORAL | 0 refills | Status: AC
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
## 2016-10-07 NOTE — [facility] Group Notes (Signed)
[facility] LCSW Group Therapy  10/07/2016 1:44 PM  Type of Therapy:  Group Therapy  Participation Level:  Active  Participation Quality:  Attentive  Affect:  Appropriate  Cognitive:  Alert and Oriented  Insight:  Improving  Engagement in Therapy:  Improving  Modes of Intervention:  Discussion, Education, Exploration, Problem-solving, Rapport Building, Socialization and Support  Summary of Progress/Problems: MHA Speaker came to talk about his personal journey with substance abuse and addiction. The pt processed ways by which to relate to the speaker. MHA speaker provided handouts and educational information pertaining to groups and services offered by the [facility].   Zola Runion N Smart LCSW 10/07/2016, 1:44 PM

## 2016-10-07 NOTE — [facility] Suicide Risk Assessment (Signed)
[facility] INPATIENT:  Family/Significant Other Suicide Prevention Education  Suicide Prevention Education:  Education Completed; Lawrence Stanton (pt's fiance) [phone] has been identified by the patient as the family member/significant other with whom the patient will be residing, and identified as the person(s) who will aid the patient in the event of a mental health crisis (suicidal ideations/suicide attempt).  With written consent from the patient, the family member/significant other has been provided the following suicide prevention education, prior to the and/or following the discharge of the patient.  The suicide prevention education provided includes the following:  Suicide risk factors  Suicide prevention and interventions  National Suicide Hotline telephone number  [facility] assessment telephone number  [facility] Emergency Assistance 911  [facility] and/or Residential Mobile Crisis Unit telephone number  Request made of family/significant other to:  Remove weapons (e.g., guns, rifles, knives), all items previously/currently identified as safety concern.    Remove drugs/medications (over-the-counter, prescriptions, illicit drugs), all items previously/currently identified as a safety concern.  The family member/significant other verbalizes understanding of the suicide prevention education information provided.  The family member/significant other agrees to remove the items of safety concern listed above.  Pt's fiance voiced no questions or concerns regarding SPE or pt's discharge plans. She stated that pt is able to return home with her at discharge.   Courteney Alderete N Smart LCSW 10/07/2016, 11:06 AM

## 2016-10-07 NOTE — Progress Notes (Signed)
Patient ID: Lawrence Filbert., male   DOB: [birth year], 55 y.o.   MRN: [id number]  DAR: Pt. Denies HI and A/V Hallucinations. He reports passive SI but is able to contract for safety. He reports sleep is fair, energy level is low, appetite is fair, and concentration is poor. He rates depression 8/10, hopelessness 7/10, and anxiety 9/10. Patient continues to report pain in right arm and remains preoccupied with his pain medications. He reports some relief when these medications are administered. He continues to utilize his arm brace. Support and encouragement provided to the patient to come to writer with questions or concerns. He is assertive but polite during conversation with staff and peers. Scheduled medications administered to patient per physician's orders. He is seen in the milieu attending groups and interacting with his peers. He continues on Ativan protocol and lists cravings, agitation, irritability, and chilling as withdrawal symptoms. Q15 minute checks are maintained for safety.

## 2016-10-07 NOTE — Progress Notes (Signed)
[facility] Group Notes:  (Nursing/MHT/Case Management/Adjunct)  Date:  10/07/2016  Time:  11:25 PM  Type of Therapy:  Psychoeducational Skills  Participation Level:  Active  Participation Quality:  Appropriate  Affect:  Appropriate  Cognitive:  Appropriate  Insight:  Appropriate  Engagement in Group:  Engaged  Modes of Intervention:  Education  Summary of Progress/Problems: Patient shared in group this evening that he had a good day overall and that he anticipates being discharged tomorrow. Patient states that he enjoyed going outside for fresh air. His goal for tomorrow is to get discharged.   [name], Payzlee Ryder S 10/07/2016, 11:25 PM

## 2016-10-07 NOTE — [facility] Suicide Risk Assessment (Signed)
[facility]   Principal Problem: Alcohol use disorder, severe, dependence (HCC) Discharge Diagnoses:  Patient Active Problem List   Diagnosis Date Noted  . Alcohol-induced mood disorder (HCC) [F10.94] 10/06/2016  . Alcohol use disorder, severe, dependence (HCC) [F10.20] 07/20/2016  . GERD (gastroesophageal reflux disease) [K21.9] 07/20/2016  . HTN (hypertension) [I10] 07/20/2016  . PTSD (post-traumatic stress disorder) [F43.10] 07/20/2016  . Tobacco use disorder [F17.200] 07/20/2016  . Closed right forearm fracture [S52.91XA] 07/20/2016  . Major depressive disorder, recurrent, severe with psychotic features (HCC) [F33.3] 07/19/2016    Total Time spent with patient: 15 minutes  Musculoskeletal: Strength & Muscle Tone: within normal limits Gait & Station: normal Patient leans: N/A  Psychiatric Specialty Exam: ROS  Blood pressure (!) 142/96, pulse 85, temperature 97.6 F (36.4 C), temperature source Oral, resp. rate 18, height 5\' 9"  (1.753 m), weight 80.7 kg (178 lb), SpO2 98 %.Body mass index is 26.29 kg/m.   General Appearance: Casual  Eye Contact::  Good  Speech:  Clear and Coherent  Volume:  Normal  Mood:  Euthymic  Affect:  Appropriate  Thought Process:  Coherent  Orientation:  Negative  Thought Content:  Negative  Suicidal Thoughts:  No  Homicidal Thoughts:  No  Memory:  Negative  Judgement:  Fair  Insight:  Fair  Psychomotor Activity:  Negative  Concentration:  Good  Recall:  Good  Fund of Knowledge:Good  Language: Good  Akathisia:  No  Handed:  Right  AIMS (if indicated):     Assets:  Resilience    Cognition: WNL  ADL's:  Intact    Mental Status Per Nursing Assessment::   On Admission:  Suicidal ideation indicated by patient, Self-harm thoughts  Demographic Factors:  Male and Caucasian  Loss Factors: Legal issues  Historical Factors: Prior suicide attempts  Risk Reduction Factors:   Positive coping skills or  problem solving skills  Continued Clinical Symptoms:  Alcohol/Substance Abuse/Dependencies Previous Psychiatric Diagnoses and Treatments  Cognitive Features That Contribute To Risk:  None    Suicide Risk:  Mild:  Suicidal ideation of limited frequency, intensity, duration, and specificity.  There are no identifiable plans, no associated intent, mild dysphoria and related symptoms, good self-control (both objective and subjective assessment), few other risk factors, and identifiable protective factors, including available and accessible social support.  Follow-up Information    [facility] Follow up on 10/12/2016.   Why:  Appt on this date at 9:00AM for hospital follow-up/medication management/assessment for counseling services. Thank you.  Contact information: [address] [place] [address] [phone]           Plan Of Care/Follow-up recommendations:  Other:  Patient denies current suicidal or homicidal ideation, plan or intent and has requested discharge to outpatient follow-up. He should keep his outpatient follow-up appointments for treatment of substance use and mental health issues.  Acquanetta SitElizabeth Woods Oates, MD 10/07/2016, 3:13 PM

## 2016-10-07 NOTE — Tx Team (Signed)
Interdisciplinary Treatment and Diagnostic Plan Update  10/07/2016 Time of Session: 9:30AM [facility]. MRN: [id number]  Principal Diagnosis: Alcohol use disorder, severe, dependence (HCC)  Secondary Diagnoses: Principal Problem:   Alcohol use disorder, severe, dependence (HCC) Active Problems:   Major depressive disorder, recurrent, severe with psychotic features (HCC)   Alcohol-induced mood disorder (HCC)   Current Medications:  Current Facility-Administered Medications  Medication Dose Route Frequency Provider Last Rate Last Dose  . acetaminophen (TYLENOL) tablet 650 mg  650 mg Oral Q6H PRN Kerry Hough, PA-C   650 mg at 10/06/16 2155  . alum & mag hydroxide-simeth (MAALOX/MYLANTA) 200-200-20 MG/5ML suspension 30 mL  30 mL Oral Q4H PRN Kerry Hough, PA-C      . docusate sodium (COLACE) capsule 100 mg  100 mg Oral Daily PRN Kerry Hough, PA-C      . fluticasone furoate-vilanterol (BREO ELLIPTA) 100-25 MCG/INH 1 puff  1 puff Inhalation Daily Kerry Hough, PA-C   1 puff at 10/07/16 0817  . gabapentin (NEURONTIN) capsule 300 mg  300 mg Oral TID Kerry Hough, PA-C   300 mg at 10/07/16 2633  . hydrOXYzine (ATARAX/VISTARIL) tablet 25 mg  25 mg Oral Q6H PRN Kerry Hough, PA-C      . ibuprofen (ADVIL,MOTRIN) tablet 600 mg  600 mg Oral Q6H PRN Kerry Hough, PA-C   600 mg at 10/06/16 2156  . ketorolac (TORADOL) 15 MG/ML injection 15 mg  15 mg Intramuscular Q12H Craige Cotta, MD   15 mg at 10/07/16 0819  . LORazepam (ATIVAN) tablet 1 mg  1 mg Oral BID Kerry Hough, PA-C   1 mg at 10/07/16 0816  . [START ON 10/08/2016] LORazepam (ATIVAN) tablet 1 mg  1 mg Oral Once Spencer E Simon, PA-C      . magnesium hydroxide (MILK OF MAGNESIA) suspension 30 mL  30 mL Oral Daily PRN Kerry Hough, PA-C      . methocarbamol (ROBAXIN) tablet 750 mg  750 mg Oral Q6H PRN Kerry Hough, PA-C   750 mg at 10/07/16 0818  . multivitamin with minerals tablet 1 tablet  1 tablet  Oral Daily Kerry Hough, PA-C   1 tablet at 10/07/16 0816  . nicotine (NICODERM CQ - dosed in mg/24 hours) patch 21 mg  21 mg Transdermal Daily Kerry Hough, PA-C   21 mg at 10/07/16 0815  . pantoprazole (PROTONIX) EC tablet 20 mg  20 mg Oral Daily Kerry Hough, PA-C   20 mg at 10/07/16 0816  . QUEtiapine (SEROQUEL) tablet 100 mg  100 mg Oral QHS,MR X 1 Kerry Hough, PA-C   100 mg at 10/06/16 2204  . thiamine (VITAMIN B-1) tablet 100 mg  100 mg Oral Daily Kerry Hough, PA-C   100 mg at 10/07/16 3545  . venlafaxine XR (EFFEXOR-XR) 24 hr capsule 75 mg  75 mg Oral Q breakfast Kerry Hough, PA-C   75 mg at 10/07/16 6256   PTA Medications: Prescriptions Prior to Admission  Medication Sig Dispense Refill Last Dose  . albuterol (PROVENTIL HFA;VENTOLIN HFA) 108 (90 Base) MCG/ACT inhaler Inhale 1-2 puffs into the lungs every 6 (six) hours as needed for wheezing or shortness of breath.   Past Week at Unknown time  . fluticasone furoate-vilanterol (BREO ELLIPTA) 100-25 MCG/INH AEPB Inhale 1 puff into the lungs daily.   10/04/2016 at Unknown time  . gabapentin (NEURONTIN) 300 MG capsule Take 1 capsule (300  mg total) by mouth 3 (three) times daily. 90 capsule 0 10/04/2016 at Unknown time  . HYDROcodone-acetaminophen (NORCO/VICODIN) 5-325 MG tablet Take 1 tablet by mouth every 6 (six) hours as needed for moderate pain.   Past Month at Unknown time  . risperiDONE (RISPERDAL) 2 MG tablet Take 1 tablet (2 mg total) by mouth at bedtime. 30 tablet 0 Past Month at Unknown time  . umeclidinium bromide (INCRUSE ELLIPTA) 62.5 MCG/INH AEPB Inhale 1 puff into the lungs daily.   10/04/2016 at Unknown time  . famotidine (PEPCID) 40 MG tablet Take 40 mg by mouth at bedtime.   10/03/2016  . traZODone (DESYREL) 150 MG tablet Take 1 tablet (150 mg total) by mouth at bedtime. 30 tablet 0 10/03/2016  . venlafaxine XR (EFFEXOR-XR) 150 MG 24 hr capsule Take 1 capsule (150 mg total) by mouth daily with breakfast. 30  capsule 0 10/03/2016    Patient Stressors: Financial difficulties Health problems Occupational concerns Substance abuse  Patient Strengths: Ability for insight Average or above average intelligence Capable of independent living Barrister's clerkCommunication skills Motivation for treatment/growth Physical Health Supportive family/friends  Treatment Modalities: Medication Management, Group therapy, Case management,  1 to 1 session with clinician, Psychoeducation, Recreational therapy.   Physician Treatment Plan for Primary Diagnosis: Alcohol use disorder, severe, dependence (HCC) Long Term Goal(s): Improvement in symptoms so as ready for discharge Improvement in symptoms so as ready for discharge   Short Term Goals: Ability to verbalize feelings will improve Ability to disclose and discuss suicidal ideas Ability to maintain clinical measurements within normal limits will improve Ability to identify changes in lifestyle to reduce recurrence of condition will improve Ability to identify and develop effective coping behaviors will improve Ability to identify triggers associated with substance abuse/mental health issues will improve  Medication Management: Evaluate patient's response, side effects, and tolerance of medication regimen.  Therapeutic Interventions: 1 to 1 sessions, Unit Group sessions and Medication administration.  Evaluation of Outcomes: Progressing  Physician Treatment Plan for Secondary Diagnosis: Principal Problem:   Alcohol use disorder, severe, dependence (HCC) Active Problems:   Major depressive disorder, recurrent, severe with psychotic features (HCC)   Alcohol-induced mood disorder (HCC)  Long Term Goal(s): Improvement in symptoms so as ready for discharge Improvement in symptoms so as ready for discharge   Short Term Goals: Ability to verbalize feelings will improve Ability to disclose and discuss suicidal ideas Ability to maintain clinical measurements within  normal limits will improve Ability to identify changes in lifestyle to reduce recurrence of condition will improve Ability to identify and develop effective coping behaviors will improve Ability to identify triggers associated with substance abuse/mental health issues will improve     Medication Management: Evaluate patient's response, side effects, and tolerance of medication regimen.  Therapeutic Interventions: 1 to 1 sessions, Unit Group sessions and Medication administration.  Evaluation of Outcomes: Progressing   RN Treatment Plan for Primary Diagnosis: Alcohol use disorder, severe, dependence (HCC) Long Term Goal(s): Knowledge of disease and therapeutic regimen to maintain health will improve  Short Term Goals: Ability to remain free from injury will improve, Ability to disclose and discuss suicidal ideas and Ability to identify and develop effective coping behaviors will improve  Medication Management: RN will administer medications as ordered by provider, will assess and evaluate patient's response and provide education to patient for prescribed medication. RN will report any adverse and/or side effects to prescribing provider.  Therapeutic Interventions: 1 on 1 counseling sessions, Psychoeducation, Medication administration, Evaluate responses to treatment,  Monitor vital signs and CBGs as ordered, Perform/monitor CIWA, COWS, AIMS and Fall Risk screenings as ordered, Perform wound care treatments as ordered.  Evaluation of Outcomes: Progressing   LCSW Treatment Plan for Primary Diagnosis: Alcohol use disorder, severe, dependence (HCC) Long Term Goal(s): Safe transition to appropriate next level of care at discharge, Engage patient in therapeutic group addressing interpersonal concerns.  Short Term Goals: Engage patient in aftercare planning with referrals and resources, Facilitate patient progression through stages of change regarding substance use diagnoses and concerns and  Identify triggers associated with mental health/substance abuse issues  Therapeutic Interventions: Assess for all discharge needs, 1 to 1 time with Social worker, Explore available resources and support systems, Assess for adequacy in community support network, Educate family and significant other(s) on suicide prevention, Complete Psychosocial Assessment, Interpersonal group therapy.  Evaluation of Outcomes: Progressing   Progress in Treatment: Attending groups: No. Participating in groups: No. Taking medication as prescribed: Yes. Toleration medication: Yes. Family/Significant other contact made: No, will contact:  pt's fiance  Patient understands diagnosis: Yes. Discussing patient identified problems/goals with staff: Yes. Medical problems stabilized or resolved: Yes. Denies suicidal/homicidal ideation: Yes. Issues/concerns per patient self-inventory: No. Other: n/a  New problem(s) identified: No, Describe:  n/a  New Short Term/Long Term Goal(s): medication stabilization; detox; development of comprehensive mental wellness/sobriety plan.   Discharge Plan or Barriers: Pt plans to return home; follow-up at [facility]. Pt also provided with Mental Health Association information and AA list for [place] and [place].   Reason for Continuation of Hospitalization: Depression Medication stabilization Withdrawal symptoms  Estimated Length of Stay: 2-3 days   Attendees: Patient: 10/07/2016 10:57 AM  Physician: Dr. Mckinley Jewel MD 10/07/2016 10:57 AM  Nursing: Lorine Bears RN 10/07/2016 10:57 AM  RN Care Manager:X 10/07/2016 10:57 AM  Social Worker: Trula Slade, LCSW; [place] Drinkard LCSW 10/07/2016 10:57 AM  Recreational Therapist:  10/07/2016 10:57 AM  Other: Serena Colonel NP; Gray Bernhardt NP 10/07/2016 10:57 AM  Other:  10/07/2016 10:57 AM  Other: 10/07/2016 10:57 AM    Scribe for Treatment Team: Ledell Peoples Smart, LCSW 10/07/2016 10:57 AM

## 2016-10-07 NOTE — Progress Notes (Signed)
[facility] MD Progress Note  10/07/2016 11:36 AM  Patient Active Problem List   Diagnosis Date Noted  . Alcohol-induced mood disorder ([place]) 10/06/2016  . Alcohol use disorder, severe, dependence ([place]) 07/20/2016  . GERD (gastroesophageal reflux disease) 07/20/2016  . HTN (hypertension) 07/20/2016  . PTSD (post-traumatic stress disorder) 07/20/2016  . Tobacco use disorder 07/20/2016  . Closed right forearm fracture 07/20/2016  . Major depressive disorder, recurrent, severe with psychotic features ([place]) 07/19/2016    Diagnosis: Alcohol use disorder, severe with withdrawal uncomplicated, history of depression with auditory hallucinations and reports history of posttraumatic stress disorder  Subjective: Patient went to the emergency room all day yesterday for evaluation of abdominal distention. He was found to have no acute concerns and he states his distention has improved. He does report that he is feeling more anxious with passive suicidal ideation, intrusive thoughts about past trauma and some auditory hallucinations today. His medications were inadvertently discontinued when he went to the emergency room and we will need to restart his Seroquel as before at 25 mg by mouth 3 times a day and we'll go up on the nighttime dose 250 mg by mouth 3 times a day to address his symptoms. Patient was briefed on the side effects expected effects rationale and alternatives and dosing and agrees to add a trial of Minipress 2 mg by mouth daily at bedtime which will also help with his mild hypertension at night for intrusive nightmares of past trauma. Patient was started on Effexor in the emergency room it appears and we discussed that this medication could be a good long-term treatment for anxiety and depression especially patient is able to abstain from alcohol. Patient reports that he is still having neck spasms with pain radiating into his biceps and is not completely wrist relieved by his current regimen. We will  order Flexeril that patient may try instead of the Robaxin for muscle spasms and also will rewrite the Toradol as it seems to be people or rewriting it as the IV dose instead of the IM dose at 30 mg for the IM injection and increase Motrin slightly as patient's current labs appear to be unremarkable.  Objective: Developed well nourished man in no apparent distress describes his mood as having odd thoughts, auditory hallucinations and anxiety and affect is somewhat anxious thought processes are linear and goal-directed thought content endorses auditory hallucinations intrusive thoughts and nightmares and passive suicidal ideation without current plan to act alert and oriented 3 insight and judgment are fair IQ appears an average range     Current Facility-Administered Medications (Cardiovascular):  .  prazosin (MINIPRESS) capsule 2 mg   Current Facility-Administered Medications (Respiratory):  .  fluticasone furoate-vilanterol (BREO ELLIPTA) 100-25 MCG/INH 1 puff   Current Facility-Administered Medications (Analgesics):  .  acetaminophen (TYLENOL) tablet 650 mg .  ibuprofen (ADVIL,MOTRIN) tablet 800 mg .  ketorolac (TORADOL) 30 MG/ML injection 30 mg     Current Facility-Administered Medications (Other):  .  alum & mag hydroxide-simeth (MAALOX/MYLANTA) 200-200-20 MG/5ML suspension 30 mL .  cyclobenzaprine (FLEXERIL) tablet 10 mg .  docusate sodium (COLACE) capsule 100 mg .  gabapentin (NEURONTIN) capsule 300 mg .  hydrOXYzine (ATARAX/VISTARIL) tablet 25 mg .  LORazepam (ATIVAN) tablet 1 mg .  [START ON 10/08/2016] LORazepam (ATIVAN) tablet 1 mg .  magnesium hydroxide (MILK OF MAGNESIA) suspension 30 mL .  multivitamin with minerals tablet 1 tablet .  nicotine (NICODERM CQ - dosed in mg/24 hours) patch 21 mg .  pantoprazole (PROTONIX)  EC tablet 20 mg .  QUEtiapine (SEROQUEL) tablet 150 mg .  QUEtiapine (SEROQUEL) tablet 25 mg .  thiamine (VITAMIN B-1) tablet 100 mg .  venlafaxine  XR (EFFEXOR-XR) 24 hr capsule 75 mg  Current Outpatient Prescriptions (Other):  .  dicyclomine (BENTYL) 20 MG tablet, Take 1 tablet (20 mg total) by mouth 2 (two) times daily as needed for spasms. .  simethicone (GAS-X) 80 MG chewable tablet, Chew 1 tablet (80 mg total) by mouth every 6 (six) hours as needed for flatulence.  Vital Signs:Blood pressure (!) 145/97, pulse (!) 121, temperature 97.6 F (36.4 C), temperature source Oral, resp. rate 18, height 5' 9"  (1.753 m), weight 80.7 kg (178 lb), SpO2 98 %.    Lab Results:  Results for orders placed or performed during the hospital encounter of 10/05/16 (from the past 48 hour(s))  TSH     Status: None   Collection Time: 10/06/16  6:18 AM  Result Value Ref Range   TSH 2.310 0.350 - 4.500 uIU/mL    Comment: Performed by a 3rd Generation assay with a functional sensitivity of <=0.01 uIU/mL. Performed at [facility]   Vitamin B12     Status: None   Collection Time: 10/06/16  6:18 AM  Result Value Ref Range   Vitamin B-12 301 180 - 914 pg/mL    Comment: (NOTE) This assay is not validated for testing neonatal or myeloproliferative syndrome specimens for Vitamin B12 levels. Performed at [facility]   Lipase, blood     Status: None   Collection Time: 10/06/16 12:41 PM  Result Value Ref Range   Lipase 35 11 - 51 U/L  Comprehensive metabolic panel     Status: Abnormal   Collection Time: 10/06/16 12:41 PM  Result Value Ref Range   Sodium 135 135 - 145 mmol/L   Potassium 4.6 3.5 - 5.1 mmol/L   Chloride 102 101 - 111 mmol/L   CO2 25 22 - 32 mmol/L   Glucose, Bld 150 (H) 65 - 99 mg/dL   BUN 21 (H) 6 - 20 mg/dL   Creatinine, Ser 1.07 0.61 - 1.24 mg/dL   Calcium 9.7 8.9 - 10.3 mg/dL   Total Protein 7.7 6.5 - 8.1 g/dL   Albumin 4.5 3.5 - 5.0 g/dL   AST 50 (H) 15 - 41 U/L   ALT 47 17 - 63 U/L   Alkaline Phosphatase 112 38 - 126 U/L   Total Bilirubin 0.8 0.3 - 1.2 mg/dL   GFR calc non Af Amer >60 >60 mL/min    GFR calc Af Amer >60 >60 mL/min    Comment: (NOTE) The eGFR has been calculated using the CKD EPI equation. This calculation has not been validated in all clinical situations. eGFR's persistently <60 mL/min signify possible Chronic Kidney Disease.    Anion gap 8 5 - 15  CBC     Status: Abnormal   Collection Time: 10/06/16 12:41 PM  Result Value Ref Range   WBC 6.6 4.0 - 10.5 K/uL   RBC 4.48 4.22 - 5.81 MIL/uL   Hemoglobin 15.1 13.0 - 17.0 g/dL   HCT 46.2 39.0 - 52.0 %   MCV 103.1 (H) 78.0 - 100.0 fL   MCH 33.7 26.0 - 34.0 pg   MCHC 32.7 30.0 - 36.0 g/dL   RDW 14.2 11.5 - 15.5 %   Platelets 145 (L) 150 - 400 K/uL  Urinalysis, Routine w reflex microscopic     Status: None   Collection  Time: 10/06/16  3:43 PM  Result Value Ref Range   Color, Urine YELLOW YELLOW   APPearance CLEAR CLEAR   Specific Gravity, Urine 1.029 1.005 - 1.030   pH 6.5 5.0 - 8.0   Glucose, UA NEGATIVE NEGATIVE mg/dL   Hgb urine dipstick NEGATIVE NEGATIVE   Bilirubin Urine NEGATIVE NEGATIVE   Ketones, ur NEGATIVE NEGATIVE mg/dL   Protein, ur NEGATIVE NEGATIVE mg/dL   Nitrite NEGATIVE NEGATIVE   Leukocytes, UA NEGATIVE NEGATIVE    Comment: MICROSCOPIC NOT DONE ON URINES WITH NEGATIVE PROTEIN, BLOOD, LEUKOCYTES, NITRITE, OR GLUCOSE <1000 mg/dL.    Physical Findings: AIMS: Facial and Oral Movements Muscles of Facial Expression: None, normal Lips and Perioral Area: None, normal Jaw: None, normal Tongue: None, normal,Extremity Movements Upper (arms, wrists, hands, fingers): None, normal Lower (legs, knees, ankles, toes): None, normal, Trunk Movements Neck, shoulders, hips: None, normal, Overall Severity Severity of abnormal movements (highest score from questions above): None, normal Incapacitation due to abnormal movements: None, normal Patient's awareness of abnormal movements (rate only patient's report): No Awareness, Dental Status Current problems with teeth and/or dentures?: No Does patient usually  wear dentures?: No  CIWA:  CIWA-Ar Total: 4 COWS:  COWS Total Score: 7   Assessment/Plan:Psychiatric symptoms anxiety depression and auditory hallucinations PTSD adjust Seroquel as above and add Minipress Hypertension add Minipress Abdominal distention appears to be resolved with current treatment Neck spasms and pain radiating into the upper right arm increase Motrin readjust Toradol and change Robaxin to Flexeril for muscle spasms Continue to monitor for any ongoing suicidal issues  Linard Millers, MD 10/07/2016, 11:36 AM
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
## 2016-10-07 NOTE — Progress Notes (Signed)
[facility] Group Notes:  (Nursing/MHT/Case Management/Adjunct)  Date:  10/07/2016  Time:  0900  Type of Therapy:  Nurse Education  Participation Level:  Active  Participation Quality:  Appropriate and Sharing  Affect:  Appropriate  Cognitive:  Alert and Oriented  Insight:  Appropriate  Engagement in Group:  Developing/Improving  Modes of Intervention:  Discussion, Education, Socialization and Support  Summary of Progress/Problems: The goal of this group was to identify a goal for today and educate on the benefits of aromatherapy. Pt discussed triggers for his PTSD and talked about stress and si thoughts. Pt is working on Pharmacologist for suicidal thoughts.  Beatrix Shipper 10/07/2016, 5:43 PM

## 2016-10-07 NOTE — Progress Notes (Signed)
Quita Skye returned from ED approximately 8pm this evening, in no apparent acute distress. He spoke of how he went to ED to have his abdomen evaluated and spoke about how he was feeling better this evening than earlier in the morning. Josph has complained of pain in regards to right arm and requested and received medications as prescribed. Byrne did attend evening group activity and did receive bedtime medications without incident and has not displayed any overt signs or symptoms of withdrawal. A. Support and encouragement provided. R. Safety maintained, will continue to monitor.

## 2016-10-08 NOTE — Tx Team (Signed)
Interdisciplinary Treatment and Diagnostic Plan Update  10/08/2016 Time of Session: 9:30AM Juwaun Inskeep [facility] MRN: [id number]  Principal Diagnosis: Alcohol use disorder, severe, dependence ([place])  Secondary Diagnoses: Principal Problem:   Alcohol use disorder, severe, dependence ([place]) Active Problems:   Major depressive disorder, recurrent, severe with psychotic features ([place])   Alcohol-induced mood disorder ([place])   Current Medications:  Current Facility-Administered Medications  Medication Dose Route Frequency Provider Last Rate Last Dose  . acetaminophen (TYLENOL) tablet 650 mg  650 mg Oral Q6H PRN Laverle Hobby, PA-C   650 mg at 10/07/16 1407  . alum & mag hydroxide-simeth (MAALOX/MYLANTA) 200-200-20 MG/5ML suspension 30 mL  30 mL Oral Q4H PRN Laverle Hobby, PA-C      . cyclobenzaprine (FLEXERIL) tablet 10 mg  10 mg Oral TID PRN Linard Millers, MD   10 mg at 10/08/16 3220  . docusate sodium (COLACE) capsule 100 mg  100 mg Oral Daily PRN Laverle Hobby, PA-C      . fluticasone furoate-vilanterol (BREO ELLIPTA) 100-25 MCG/INH 1 puff  1 puff Inhalation Daily Laverle Hobby, PA-C   1 puff at 10/08/16 [phone]  . gabapentin (NEURONTIN) capsule 300 mg  300 mg Oral TID Laverle Hobby, PA-C   300 mg at 10/08/16 7062  . hydrOXYzine (ATARAX/VISTARIL) tablet 25 mg  25 mg Oral Q6H PRN Laverle Hobby, PA-C   25 mg at 10/08/16 0101  . ibuprofen (ADVIL,MOTRIN) tablet 800 mg  800 mg Oral Q6H PRN Linard Millers, MD   800 mg at 10/07/16 1407  . ketorolac (TORADOL) 30 MG/ML injection 30 mg  30 mg Intramuscular Q8H PRN Linard Millers, MD   30 mg at 10/08/16 0101  . LORazepam (ATIVAN) tablet 1 mg  1 mg Oral BID Laverle Hobby, PA-C   1 mg at 10/08/16 0830  . magnesium hydroxide (MILK OF MAGNESIA) suspension 30 mL  30 mL Oral Daily PRN Laverle Hobby, PA-C      . multivitamin with minerals tablet 1 tablet  1 tablet Oral Daily Laverle Hobby, PA-C   1 tablet at 10/08/16 0830   . nicotine (NICODERM CQ - dosed in mg/24 hours) patch 21 mg  21 mg Transdermal Daily Laverle Hobby, PA-C   21 mg at 10/08/16 0800  . pantoprazole (PROTONIX) EC tablet 20 mg  20 mg Oral Daily Laverle Hobby, PA-C   20 mg at 10/08/16 0830  . prazosin (MINIPRESS) capsule 2 mg  2 mg Oral QHS Linard Millers, MD   2 mg at 10/07/16 2131  . QUEtiapine (SEROQUEL) tablet 150 mg  150 mg Oral QHS Linard Millers, MD   150 mg at 10/07/16 2132  . QUEtiapine (SEROQUEL) tablet 25 mg  25 mg Oral TID Linard Millers, MD   25 mg at 10/08/16 0830  . thiamine (VITAMIN B-1) tablet 100 mg  100 mg Oral Daily Laverle Hobby, PA-C   100 mg at 10/08/16 0830  . venlafaxine XR (EFFEXOR-XR) 24 hr capsule 75 mg  75 mg Oral Q breakfast Laverle Hobby, PA-C   75 mg at 10/08/16 3762   PTA Medications: Prescriptions Prior to Admission  Medication Sig Dispense Refill Last Dose  . albuterol (PROVENTIL HFA;VENTOLIN HFA) 108 (90 Base) MCG/ACT inhaler Inhale 1-2 puffs into the lungs every 6 (six) hours as needed for wheezing or shortness of breath.   Past Week at Unknown time  . fluticasone furoate-vilanterol (BREO ELLIPTA) 100-25 MCG/INH  AEPB Inhale 1 puff into the lungs daily.   10/04/2016 at Unknown time  . gabapentin (NEURONTIN) 300 MG capsule Take 1 capsule (300 mg total) by mouth 3 (three) times daily. 90 capsule 0 10/04/2016 at Unknown time  . HYDROcodone-acetaminophen (NORCO/VICODIN) 5-325 MG tablet Take 1 tablet by mouth every 6 (six) hours as needed for moderate pain.   Past Month at Unknown time  . risperiDONE (RISPERDAL) 2 MG tablet Take 1 tablet (2 mg total) by mouth at bedtime. 30 tablet 0 Past Month at Unknown time  . umeclidinium bromide (INCRUSE ELLIPTA) 62.5 MCG/INH AEPB Inhale 1 puff into the lungs daily.   10/04/2016 at Unknown time  . famotidine (PEPCID) 40 MG tablet Take 40 mg by mouth at bedtime.   10/03/2016  . traZODone (DESYREL) 150 MG tablet Take 1 tablet (150 mg total) by mouth at  bedtime. 30 tablet 0 10/03/2016  . venlafaxine XR (EFFEXOR-XR) 150 MG 24 hr capsule Take 1 capsule (150 mg total) by mouth daily with breakfast. 30 capsule 0 10/03/2016    Patient Stressors: Financial difficulties Health problems Occupational concerns Substance abuse  Patient Strengths: Ability for insight Average or above average intelligence Capable of independent living Agricultural engineer for treatment/growth Physical Health Supportive family/friends  Treatment Modalities: Medication Management, Group therapy, Case management,  1 to 1 session with clinician, Psychoeducation, Recreational therapy.   Physician Treatment Plan for Primary Diagnosis: Alcohol use disorder, severe, dependence ([place]) Long Term Goal(s): Improvement in symptoms so as ready for discharge Improvement in symptoms so as ready for discharge   Short Term Goals: Ability to verbalize feelings will improve Ability to disclose and discuss suicidal ideas Ability to maintain clinical measurements within normal limits will improve Ability to identify changes in lifestyle to reduce recurrence of condition will improve Ability to identify and develop effective coping behaviors will improve Ability to identify triggers associated with substance abuse/mental health issues will improve  Medication Management: Evaluate patient's response, side effects, and tolerance of medication regimen.  Therapeutic Interventions: 1 to 1 sessions, Unit Group sessions and Medication administration.  Evaluation of Outcomes: Met  Physician Treatment Plan for Secondary Diagnosis: Principal Problem:   Alcohol use disorder, severe, dependence ([place]) Active Problems:   Major depressive disorder, recurrent, severe with psychotic features ([place])   Alcohol-induced mood disorder ([place])  Long Term Goal(s): Improvement in symptoms so as ready for discharge Improvement in symptoms so as ready for discharge   Short Term Goals:  Ability to verbalize feelings will improve Ability to disclose and discuss suicidal ideas Ability to maintain clinical measurements within normal limits will improve Ability to identify changes in lifestyle to reduce recurrence of condition will improve Ability to identify and develop effective coping behaviors will improve Ability to identify triggers associated with substance abuse/mental health issues will improve     Medication Management: Evaluate patient's response, side effects, and tolerance of medication regimen.  Therapeutic Interventions: 1 to 1 sessions, Unit Group sessions and Medication administration.  Evaluation of Outcomes: Met   RN Treatment Plan for Primary Diagnosis: Alcohol use disorder, severe, dependence ([place]) Long Term Goal(s): Knowledge of disease and therapeutic regimen to maintain health will improve  Short Term Goals: Ability to remain free from injury will improve, Ability to disclose and discuss suicidal ideas and Ability to identify and develop effective coping behaviors will improve  Medication Management: RN will administer medications as ordered by provider, will assess and evaluate patient's response and provide education to patient for prescribed medication. RN  will report any adverse and/or side effects to prescribing provider.  Therapeutic Interventions: 1 on 1 counseling sessions, Psychoeducation, Medication administration, Evaluate responses to treatment, Monitor vital signs and CBGs as ordered, Perform/monitor CIWA, COWS, AIMS and Fall Risk screenings as ordered, Perform wound care treatments as ordered.  Evaluation of Outcomes: Met   LCSW Treatment Plan for Primary Diagnosis: Alcohol use disorder, severe, dependence ([place]) Long Term Goal(s): Safe transition to appropriate next level of care at discharge, Engage patient in therapeutic group addressing interpersonal concerns.  Short Term Goals: Engage patient in aftercare planning with referrals and  resources, Facilitate patient progression through stages of change regarding substance use diagnoses and concerns and Identify triggers associated with mental health/substance abuse issues  Therapeutic Interventions: Assess for all discharge needs, 1 to 1 time with Social worker, Explore available resources and support systems, Assess for adequacy in community support network, Educate family and significant other(s) on suicide prevention, Complete Psychosocial Assessment, Interpersonal group therapy.  Evaluation of Outcomes: Met   Progress in Treatment: Attending groups: yes Participating in groups: Yes Taking medication as prescribed: Yes. Toleration medication: Yes. Family/Significant other contact made: SPE completed with pt's fiance. Patient understands diagnosis: Yes. Discussing patient identified problems/goals with staff: Yes. Medical problems stabilized or resolved: Yes. Denies suicidal/homicidal ideation: Yes. Issues/concerns per patient self-inventory: No. Other: n/a  New problem(s) identified: No, Describe:  n/a  New Short Term/Long Term Goal(s): medication stabilization; detox; development of comprehensive mental wellness/sobriety plan.   Discharge Plan or Barriers: Pt plans to return home; [facility] has been scheduled. Pt also provided with Mental Health Association information and AA list for [place] and [place].   Reason for Continuation of Hospitalization: none  Estimated Length of Stay: d/c today   Attendees: Patient: 10/08/2016 8:55 AM  Physician: Dr. Sharolyn Douglas MD 10/08/2016 8:55 AM  Nursing: Jasmine Awe RN 10/08/2016 8:55 AM  RN Care Manager:X 10/08/2016 8:55 AM  Social Worker: Maxie Better, LCSW; [place] Dr[place] LCSW 10/08/2016 8:55 AM  Recreational Therapist:  10/08/2016 8:55 AM  Other: Agustina Caroli NP 10/08/2016 8:55 AM  Other:  10/08/2016 8:55 AM  Other: 10/08/2016 8:55 AM    Scribe for Treatment Team: [place],  LCSW 10/08/2016 8:55 AM

## 2016-10-08 NOTE — Progress Notes (Signed)
Adult Psychoeducational Group Note  Date:  10/08/2016 Time:  10:20 AM  Group Topic/Focus:  Goals Group:   The focus of this group is to help patients establish daily goals to achieve during treatment and discuss how the patient can incorporate goal setting into their daily lives to aide in recovery.   Participation Level:  Active  Participation Quality:  Appropriate  Affect:  Appropriate  Cognitive:  Appropriate  Insight: Appropriate  Engagement in Group:  Engaged  Modes of Intervention:  Discussion  Additional Comments:  Pt is here because of suicidal thoughts although pt states that he doesn't feel suicidal at this time, he says that it comes and goes.  Pt wants to continue taking his medication. Brock Mokry R Wynn Kernes 10/08/2016, 10:20 AM

## 2016-10-08 NOTE — Progress Notes (Signed)
  [facility] :  Will you be returning to the same living situation after discharge:  Yes,  home with fiance At discharge, do you have transportation home?: Yes,  pt's fiance coming after lunch to pick him up. Do you have the ability to pay for your medications: Yes,  mental health  Release of information consent forms completed and submitted to medical records by CSW.  Patient to Follow up at: Follow-up Information    [facility] Follow up on 10/12/2016.   Why:  Appt on this date at 9:00AM for hospital follow-up/medication management/assessment for counseling services. Thank you.  Contact information: [address] [place] [address] [phone]           Next level of care provider has access to [facility] Link:no  Safety Planning and Suicide Prevention discussed: Yes,  SPE completed with pt's fiance.  Have you used any form of tobacco in the last 30 days? (Cigarettes, Smokeless Tobacco, Cigars, and/or Pipes): Yes  Has patient been referred to the Quitline?: Patient refused referral  Patient has been referred for addiction treatment: Yes  Elaysia Devargas N Smart LCSW 10/08/2016, 8:54 AM

## 2016-10-08 NOTE — Progress Notes (Signed)
Recreation Therapy Notes  Date: 10/08/16 Time: 0930 Location: 300 Hall Dayroom  Group Topic: Stress Management  Goal Area(s) Addresses:  Patient will verbalize importance of using healthy stress management.  Patient will identify positive emotions associated with healthy stress management.   Behavioral Response: Engaged  Intervention: Stress Management  Activity :  Guided Imagery.  LRT introduced the stress management technique of guided imagery.  LRT read a script so patients could experience the concept of guided imagery.  Patients were to follow along as LRT read script to engage in activity.  Education:  Stress Management, Discharge Planning.   Education Outcome: Acknowledges edcuation/In group clarification offered/Needs additional education  Clinical Observations/Feedback: Pt attended group.   Caroll Rancher, LRT/CTRS      Caroll Rancher A 10/08/2016 11:33 AM

## 2016-10-08 NOTE — Progress Notes (Signed)
Patient discharged to lobby. Patient was stable and appreciative at that time. All papers, samples and prescriptions were given and valuables returned. Verbal understanding expressed. Denies SI/HI and A/VH. Patient given opportunity to express concerns and ask questions.  

## 2016-10-29 ENCOUNTER — Emergency Department (HOSPITAL_COMMUNITY)
Admission: EM | Admit: 2016-10-29 | Discharge: 2016-10-30 | Disposition: A | Discharge status: Home / Self Care | Payer: No Typology Code available for payment source | Attending: Emergency Medicine | Admitting: Emergency Medicine

## 2016-10-29 ENCOUNTER — Encounter (HOSPITAL_COMMUNITY): Payer: Self-pay

## 2016-10-29 ENCOUNTER — Ambulatory Visit (HOSPITAL_COMMUNITY)
Admission: RE | Admit: 2016-10-29 | Discharge: 2016-10-29 | Disposition: A | Discharge status: Home / Self Care | Payer: No Typology Code available for payment source | Attending: Psychiatry | Admitting: Psychiatry

## 2016-10-29 DIAGNOSIS — F1094 Alcohol use, unspecified with alcohol-induced mood disorder: Secondary | ICD-10-CM | POA: Insufficient documentation

## 2016-10-29 DIAGNOSIS — F1024 Alcohol dependence with alcohol-induced mood disorder: Secondary | ICD-10-CM | POA: Insufficient documentation

## 2016-10-29 DIAGNOSIS — I10 Essential (primary) hypertension: Secondary | ICD-10-CM | POA: Insufficient documentation

## 2016-10-29 DIAGNOSIS — K219 Gastro-esophageal reflux disease without esophagitis: Secondary | ICD-10-CM | POA: Insufficient documentation

## 2016-10-29 DIAGNOSIS — F419 Anxiety disorder, unspecified: Secondary | ICD-10-CM | POA: Insufficient documentation

## 2016-10-29 DIAGNOSIS — K859 Acute pancreatitis without necrosis or infection, unspecified: Secondary | ICD-10-CM | POA: Insufficient documentation

## 2016-10-29 DIAGNOSIS — G7000 Myasthenia gravis without (acute) exacerbation: Secondary | ICD-10-CM | POA: Insufficient documentation

## 2016-10-29 DIAGNOSIS — F17210 Nicotine dependence, cigarettes, uncomplicated: Secondary | ICD-10-CM | POA: Insufficient documentation

## 2016-10-29 DIAGNOSIS — F319 Bipolar disorder, unspecified: Secondary | ICD-10-CM | POA: Insufficient documentation

## 2016-10-29 DIAGNOSIS — J449 Chronic obstructive pulmonary disease, unspecified: Secondary | ICD-10-CM | POA: Insufficient documentation

## 2016-10-29 DIAGNOSIS — G40909 Epilepsy, unspecified, not intractable, without status epilepticus: Secondary | ICD-10-CM | POA: Insufficient documentation

## 2016-10-29 DIAGNOSIS — Z1389 Encounter for screening for other disorder: Secondary | ICD-10-CM | POA: Insufficient documentation

## 2016-10-29 DIAGNOSIS — G20 Parkinson's disease: Secondary | ICD-10-CM | POA: Insufficient documentation

## 2016-10-29 DIAGNOSIS — R45851 Suicidal ideations: Secondary | ICD-10-CM

## 2016-10-29 DIAGNOSIS — G35 Multiple sclerosis: Secondary | ICD-10-CM | POA: Insufficient documentation

## 2016-10-29 DIAGNOSIS — Z79899 Other long term (current) drug therapy: Secondary | ICD-10-CM | POA: Insufficient documentation

## 2016-10-29 DIAGNOSIS — F1020 Alcohol dependence, uncomplicated: Secondary | ICD-10-CM | POA: Diagnosis present

## 2016-10-29 DIAGNOSIS — K529 Noninfective gastroenteritis and colitis, unspecified: Secondary | ICD-10-CM | POA: Insufficient documentation

## 2016-10-29 LAB — COMPREHENSIVE METABOLIC PANEL
ALBUMIN: 4.5 g/dL (ref 3.5–5.0)
ALK PHOS: 112 U/L (ref 38–126)
ALT: 51 U/L (ref 17–63)
AST: 44 U/L — AB (ref 15–41)
Anion gap: 9 (ref 5–15)
BILIRUBIN TOTAL: 0.8 mg/dL (ref 0.3–1.2)
BUN: 22 mg/dL — AB (ref 6–20)
CALCIUM: 9.7 mg/dL (ref 8.9–10.3)
CO2: 24 mmol/L (ref 22–32)
CREATININE: 0.82 mg/dL (ref 0.61–1.24)
Chloride: 103 mmol/L (ref 101–111)
GFR calc Af Amer: >60 mL/min (ref >60)
GFR calc non Af Amer: >60 mL/min (ref >60)
Glucose, Bld: 85 mg/dL (ref 65–99)
Potassium: 4 mmol/L (ref 3.5–5.1)
Sodium: 136 mmol/L (ref 135–145)
Total Protein: 7.7 g/dL (ref 6.5–8.1)

## 2016-10-29 LAB — CBC
HEMATOCRIT: 44.3 % (ref 39.0–52.0)
Hemoglobin: 15 g/dL (ref 13.0–17.0)
MCH: 33.2 pg (ref 26.0–34.0)
MCHC: 33.9 g/dL (ref 30.0–36.0)
MCV: 98 fL (ref 78.0–100.0)
Platelets: 129 10*3/uL — ABNORMAL LOW (ref 150–400)
RBC: 4.52 MIL/uL (ref 4.22–5.81)
RDW: 14.5 % (ref 11.5–15.5)
WBC: 7.5 10*3/uL (ref 4.0–10.5)

## 2016-10-29 LAB — ACETAMINOPHEN LEVEL: Acetaminophen (Tylenol), Serum: <10 ug/mL — ABNORMAL LOW (ref 10–30)

## 2016-10-29 LAB — ETHANOL: Alcohol, Ethyl (B): <5 mg/dL (ref <5)

## 2016-10-29 LAB — SALICYLATE LEVEL: Salicylate Lvl: <7 mg/dL (ref 2.8–30.0)

## 2016-10-29 MED ORDER — LORAZEPAM 1 MG PO TABS: 0.0000 mg | ORAL_TABLET | Freq: Two times a day (BID) | ORAL | Status: DC

## 2016-10-29 MED ORDER — THIAMINE HCL 100 MG/ML IJ SOLN: 100.0000 mg | Freq: Every day | INTRAMUSCULAR | Status: DC

## 2016-10-29 MED ORDER — LORAZEPAM 1 MG PO TABS
0.0000 mg | ORAL_TABLET | Freq: Four times a day (QID) | ORAL | Status: DC
  Administered 2016-10-30: 1 mg via ORAL
  Administered 2016-10-30: 2 mg via ORAL
  Filled 2016-10-29: qty 1
  Filled 2016-10-29: qty 2

## 2016-10-29 MED ORDER — VITAMIN B-1 100 MG PO TABS
100.0000 mg | ORAL_TABLET | Freq: Every day | ORAL | Status: DC
  Administered 2016-10-30: 100 mg via ORAL
  Filled 2016-10-29: qty 1

## 2016-10-29 NOTE — ED Provider Notes (Signed)
[facility]-EMERGENCY DEPT Provider Note   CSN: [id number] Arrival date & time: 10/29/16  2052     History   Chief Complaint Chief Complaint  Patient presents with  . Suicidal    HPI Lawrence FilbertJames Kenneth Reffner Jr. is a 55 y.o. male.  The history is provided by the patient.  Mental Health Problem  Presenting symptoms: depression, hallucinations and suicidal thoughts   Degree of incapacity (severity):  Moderate Onset quality:  Gradual Duration: several months. Timing:  Intermittent Progression:  Waxing and waning Context: alcohol use, noncompliance and stressful life event   Treatment compliance:  Some of the time Relieved by:  Nothing Worsened by:  Alcohol (life situation) Associated symptoms: no abdominal pain, no chest pain and no headaches   Risk factors: hx of mental illness, hx of suicide attempts and recent psychiatric admission     Past Medical History:  Diagnosis Date  . Anxiety   . Bipolar disorder (HCC)   . Colitis   . COPD (chronic obstructive pulmonary disease) (HCC)   . Depression   . Diverticulitis   . Epilepsy (HCC)   . GERD (gastroesophageal reflux disease)   . Hypertension   . Kidney stones   . Metabolic encephalopathy   . Migraine   . Multiple sclerosis (HCC)   . Myasthenia gravis (HCC)   . Pancreatitis   . Parkinson's disease [facility](HCC)     Patient Active Problem List   Diagnosis Date Noted  . Alcohol-induced mood disorder (HCC) 10/06/2016  . Alcohol use disorder, severe, dependence (HCC) 07/20/2016  . GERD (gastroesophageal reflux disease) 07/20/2016  . HTN (hypertension) 07/20/2016  . PTSD (post-traumatic stress disorder) 07/20/2016  . Tobacco use disorder 07/20/2016  . Closed right forearm fracture 07/20/2016  . Major depressive disorder, recurrent, severe with psychotic features (HCC) 07/19/2016    Past Surgical History:  Procedure Laterality Date  . APPENDECTOMY         Home Medications    Prior to Admission medications   Medication  Sig Start Date End Date Taking? Authorizing Provider  dicyclomine (BENTYL) 20 MG tablet Take 1 tablet (20 mg total) by mouth 2 (two) times daily as needed for spasms. 10/06/16  Yes Joshua Geiple, PA-C  fluticasone furoate-vilanterol (BREO ELLIPTA) 100-25 MCG/INH AEPB Inhale 1 puff into the lungs daily.   Yes Historical Provider, MD  gabapentin (NEURONTIN) 300 MG capsule Take 1 capsule (300 mg total) by mouth 3 (three) times daily. 10/07/16  Yes Adonis BrookSheila Agustin, NP  hydrOXYzine (ATARAX/VISTARIL) 25 MG tablet Take 1 tablet (25 mg total) by mouth every 6 (six) hours as needed for anxiety. 10/07/16  Yes Adonis BrookSheila Agustin, NP  nicotine (NICODERM CQ - DOSED IN MG/24 HOURS) 21 mg/24hr patch Place 1 patch (21 mg total) onto the skin daily. 10/08/16  Yes Adonis BrookSheila Agustin, NP  pantoprazole (PROTONIX) 20 MG tablet Take 1 tablet (20 mg total) by mouth daily. 10/08/16  Yes Adonis BrookSheila Agustin, NP  prazosin (MINIPRESS) 2 MG capsule Take 1 capsule (2 mg total) by mouth at bedtime. 10/07/16  Yes Adonis BrookSheila Agustin, NP  QUEtiapine (SEROQUEL) 25 MG tablet Take 1 tablet (25 mg total) by mouth 3 (three) times daily. 10/07/16  Yes Adonis BrookSheila Agustin, NP  QUEtiapine (SEROQUEL) 50 MG tablet Take 3 tablets (150 mg total) by mouth at bedtime. 10/07/16  Yes Adonis BrookSheila Agustin, NP  simethicone (GAS-X) 80 MG chewable tablet Chew 1 tablet (80 mg total) by mouth every 6 (six) hours as needed for flatulence. 10/06/16  Yes Renne CriglerJoshua Geiple, PA-C  umeclidinium bromide (  INCRUSE ELLIPTA) 62.5 MCG/INH AEPB Inhale 1 puff into the lungs daily.   Yes Historical Provider, MD  venlafaxine XR (EFFEXOR-XR) 75 MG 24 hr capsule Take 1 capsule (75 mg total) by mouth daily with breakfast. 10/08/16  Yes Adonis Brook, NP    Family History History reviewed. No pertinent family history.  Social History Social History  Substance Use Topics  . Smoking status: Current Every Day Smoker    Packs/day: 1.00    Years: 15.00    Types: Cigarettes  . Smokeless tobacco: Never  Used  . Alcohol use 7.2 oz/week    6 Cans of beer, 6 Shots of liquor per week     Allergies   Sulfa antibiotics   Review of Systems Review of Systems  Cardiovascular: Negative for chest pain.  Gastrointestinal: Negative for abdominal pain.  Neurological: Negative for headaches.  Psychiatric/Behavioral: Positive for hallucinations and suicidal ideas.  Ten systems are reviewed and are negative for acute change except as noted in the HPI    Physical Exam Updated Vital Signs BP (!) 130/105 (BP Location: Right Arm)   Pulse 112   Temp 97.7 F (36.5 C) (Oral)   Resp 18   SpO2 100%   Physical Exam  Constitutional: He is oriented to person, place, and time. He appears well-developed and well-nourished. No distress.  HENT:  Head: Normocephalic and atraumatic.  Nose: Nose normal.  Eyes: Conjunctivae and EOM are normal. Pupils are equal, round, and reactive to light. Right eye exhibits no discharge. Left eye exhibits no discharge. No scleral icterus.  Neck: Normal range of motion. Neck supple.  Cardiovascular: Normal rate and regular rhythm.  Exam reveals no gallop and no friction rub.   No murmur heard. Pulmonary/Chest: Effort normal and breath sounds normal. No stridor. No respiratory distress. He has no rales.  Abdominal: Soft. He exhibits no distension. There is no tenderness.  Musculoskeletal: He exhibits no edema or tenderness.  Neurological: He is alert and oriented to person, place, and time.  Skin: Skin is warm and dry. No rash noted. He is not diaphoretic. No erythema.  Psychiatric: He has a normal mood and affect.  Vitals reviewed.    ED Treatments / Results  Labs (all labs ordered are listed, but only abnormal results are displayed) Labs Reviewed  COMPREHENSIVE METABOLIC PANEL - Abnormal; Notable for the following:       Result Value   BUN 22 (*)    AST 44 (*)    All other components within normal limits  ACETAMINOPHEN LEVEL - Abnormal; Notable for the  following:    Acetaminophen (Tylenol), Serum <10 (*)    All other components within normal limits  CBC - Abnormal; Notable for the following:    Platelets 129 (*)    All other components within normal limits  ETHANOL  SALICYLATE LEVEL  RAPID URINE DRUG SCREEN, HOSP PERFORMED    EKG  EKG Interpretation None       Radiology No results found.  Procedures Procedures (including critical care time)  Medications Ordered in ED Medications  LORazepam (ATIVAN) tablet 0-4 mg (1 mg Oral Given 10/30/16 0022)    Followed by  LORazepam (ATIVAN) tablet 0-4 mg (not administered)  thiamine (VITAMIN B-1) tablet 100 mg (not administered)    Or  thiamine (B-1) injection 100 mg (not administered)     Initial Impression / Assessment and Plan / ED Course  I have reviewed the triage vital signs and the nursing notes.  Pertinent labs & imaging  results that were available during my care of the patient were reviewed by me and considered in my medical decision making (see chart for details).  Clinical Course     High risk patient given frequent attempts. Patient medically cleared for behavioral health evaluation and management.  Final Clinical Impressions(s) / ED Diagnoses   Final diagnoses:  Suicidal ideation      Nira Conn, MD 10/30/16 [phone]

## 2016-10-29 NOTE — ED Triage Notes (Signed)
Patient states he is suicidal and needs detox from alcohol.

## 2016-10-29 NOTE — H&P (Signed)
Behavioral Health Medical Screening Exam  Lawrence CongressJames Kenneth Marquis BuggyGoins Jr. is an 55 y.o. male.  Total Time spent with patient: 20 minutes  Psychiatric Specialty Exam: Physical Exam  Constitutional: He is oriented to person, place, and time. He appears well-developed and well-nourished. No distress.  HENT:  Head: Normocephalic and atraumatic.  Right Ear: External ear normal.  Left Ear: External ear normal.  Eyes: Conjunctivae are normal. Right eye exhibits no discharge. Left eye exhibits no discharge. No scleral icterus.  Cardiovascular: Regular rhythm and normal heart sounds.  Tachycardia present.   Respiratory: Effort normal and breath sounds normal. No respiratory distress.  GI: Bowel sounds are normal. There is no tenderness.  Musculoskeletal: Normal range of motion.  Neurological: He is alert and oriented to person, place, and time. He displays tremor.  Skin: Skin is warm and dry. He is not diaphoretic.  Psychiatric: His speech is normal. His mood appears anxious. His affect is labile. His affect is not blunt. Thought content is not paranoid and not delusional. Cognition and memory are normal. He expresses impulsivity and inappropriate judgment. He exhibits a depressed mood. He expresses suicidal ideation. He expresses no homicidal ideation. He expresses suicidal plans. He is inattentive.    Review of Systems  Psychiatric/Behavioral: Positive for depression, substance abuse and suicidal ideas. Negative for hallucinations and memory loss. The patient is nervous/anxious and has insomnia.   All other systems reviewed and are negative.   Blood pressure (!) 172/74, pulse (!) 111, temperature 98.4 F (36.9 C), resp. rate 20, SpO2 100 %.There is no height or weight on file to calculate BMI.  General Appearance: Disheveled  Eye Contact:  Fair  Speech:  Clear and Coherent and Normal Rate  Volume:  Normal  Mood:  Anxious, Depressed, Dysphoric, Hopeless, Irritable and Worthless  Affect:  Congruent  and Depressed  Thought Process:  Coherent and Goal Directed  Orientation:  Full (Time, Place, and Person)  Thought Content:  Logical, Hallucinations: None and Rumination  Suicidal Thoughts:  Yes.  with intent/plan vague plan to crash car  Homicidal Thoughts:  No  Memory:  Immediate;   Good Recent;   Good Remote;   Good  Judgement:  Impaired  Insight:  Fair  Psychomotor Activity:  Normal  Concentration: Concentration: Fair and Attention Span: Fair  Recall:  Good  Fund of Knowledge:Good  Language: Good  Akathisia:  NA  Handed:  Right  AIMS (if indicated):     Assets:  Communication Skills Desire for Improvement Housing Intimacy  Sleep:       Musculoskeletal: Strength & Muscle Tone: within normal limits Gait & Station: normal Patient leans: N/A  Blood pressure (!) 172/74, pulse (!) 111, temperature 98.4 F (36.9 C), resp. rate 20, SpO2 100 %.  Recommendations:  Based on my evaluation the patient does not appear to have an emergency medical condition. TTS to disposition after medical clearance.  Jackelyn PolingJason A Ayza Ripoll, NP 10/29/2016, 9:47 PM
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
## 2016-10-30 DIAGNOSIS — F1094 Alcohol use, unspecified with alcohol-induced mood disorder: Secondary | ICD-10-CM | POA: Diagnosis not present

## 2016-10-30 DIAGNOSIS — Z79899 Other long term (current) drug therapy: Secondary | ICD-10-CM

## 2016-10-30 DIAGNOSIS — Z882 Allergy status to sulfonamides status: Secondary | ICD-10-CM | POA: Diagnosis not present

## 2016-10-30 DIAGNOSIS — F17210 Nicotine dependence, cigarettes, uncomplicated: Secondary | ICD-10-CM

## 2016-10-30 LAB — RAPID URINE DRUG SCREEN, HOSP PERFORMED
AMPHETAMINES: NOT DETECTED
BENZODIAZEPINES: POSITIVE — AB
Barbiturates: NOT DETECTED
COCAINE: NOT DETECTED
OPIATES: NOT DETECTED
TETRAHYDROCANNABINOL: POSITIVE — AB

## 2016-10-30 MED ORDER — FOLIC ACID 1 MG PO TABS
1.0000 mg | ORAL_TABLET | Freq: Every day | ORAL | Status: DC
  Administered 2016-10-30: 1 mg via ORAL
  Filled 2016-10-30: qty 1

## 2016-10-30 MED ORDER — LORAZEPAM 1 MG PO TABS: 1.0000 mg | ORAL_TABLET | Freq: Four times a day (QID) | ORAL | Status: DC | PRN

## 2016-10-30 MED ORDER — ADULT MULTIVITAMIN W/MINERALS CH
1.0000 | ORAL_TABLET | Freq: Every day | ORAL | Status: DC
  Administered 2016-10-30: 1 via ORAL
  Filled 2016-10-30: qty 1

## 2016-10-30 MED ORDER — LORAZEPAM 1 MG PO TABS: 1.0000 mg | ORAL_TABLET | Freq: Once | ORAL | Status: DC

## 2016-10-30 MED ORDER — LORAZEPAM 2 MG/ML IJ SOLN
1.0000 mg | Freq: Four times a day (QID) | INTRAMUSCULAR | Status: DC | PRN
Start: 2016-10-30 — End: 2016-10-30

## 2016-10-30 NOTE — ED Notes (Signed)
Once patient in room TCU 27 is moved, SAPU will take room 30.

## 2016-10-30 NOTE — Consult Note (Signed)
[facility] Consult   Reason for Consult:  Alcohol abuse with suicidal ideations Referring Physician:  EDP Patient Identification: Lawrence Stanton. MRN:  [id number] Principal Diagnosis: Alcohol-induced mood disorder ([place]) Diagnosis:   Patient Active Problem List   Diagnosis Date Noted  . Alcohol-induced mood disorder ([place]) [F10.94] 10/06/2016    Priority: High  . Alcohol use disorder, severe, dependence ([place]) [F10.20] 07/20/2016    Priority: High  . GERD (gastroesophageal reflux disease) [K21.9] 07/20/2016  . HTN (hypertension) [I10] 07/20/2016  . PTSD (post-traumatic stress disorder) [F43.10] 07/20/2016  . Tobacco use disorder [F17.200] 07/20/2016  . Closed right forearm fracture [S52.91XA] 07/20/2016  . Major depressive disorder, recurrent, severe with psychotic features ([place]) [F33.3] 07/19/2016    Total Time spent with patient: 45 minutes  Subjective:   Lawrence Rueda. is a 55 y.o. male patient reports his fiance who he lives with told him he needed to get help or not to come home.  HPI:  55 yo male who presented to the ED wanting alcohol rehab and hospitalization.  He just left [place] who had referred him to [facility] but he did not want to go so he came here.  The motivation appears to be his fiance who told him not to return home until he go some help for alcohol abuse.  BAL negative as he has been detoxed at his ED stay at [facility] and here.  No suicidal/homicidal ideations, hallucinations, and withdrawal symptoms.  Stable for discharge, encouraged him to use his resources.  He stated he could not afford his medications but also reported spending 10-15 dollars a day for alcohol.  Past Psychiatric History: alcohol abuse, depression, PTSD  Risk to Self:  None Risk to Others:  None Prior Inpatient Therapy:  Yes Prior Outpatient Therapy:  None  Past Medical History:  Past Medical History:  Diagnosis Date  . Anxiety   . Bipolar disorder ([place])    . Colitis   . COPD (chronic obstructive pulmonary disease) ([place])   . Depression   . Diverticulitis   . Epilepsy ([place])   . GERD (gastroesophageal reflux disease)   . Hypertension   . Kidney stones   . Metabolic encephalopathy   . Migraine   . Multiple sclerosis ([place])   . Myasthenia gravis ([place])   . Pancreatitis   . Parkinson's disease [facility])     Past Surgical History:  Procedure Laterality Date  . APPENDECTOMY     Family History: History reviewed. No pertinent family history. Family Psychiatric  History: none Social History:  History  Alcohol Use  . 7.2 oz/week  . 6 Cans of beer, 6 Shots of liquor per week     History  Drug Use No    Social History   Social History  . Marital status: Divorced    Spouse name: N/A  . Number of children: N/A  . Years of education: N/A   Social History Main Topics  . Smoking status: Current Every Day Smoker    Packs/day: 1.00    Years: 15.00    Types: Cigarettes  . Smokeless tobacco: Never Used  . Alcohol use 7.2 oz/week    6 Cans of beer, 6 Shots of liquor per week  . Drug use: No  . Sexual activity: Yes    Birth control/ protection: None   Other Topics Concern  . None   Social History Narrative  . None   Additional Social History:    Allergies:   Allergies  Allergen  Reactions  . Sulfa Antibiotics Hives and Rash    Labs:  Results for orders placed or performed during the hospital encounter of 10/29/16 (from the past 48 hour(s))  Comprehensive metabolic panel     Status: Abnormal   Collection Time: 10/29/16  9:44 PM  Result Value Ref Range   Sodium 136 135 - 145 mmol/L   Potassium 4.0 3.5 - 5.1 mmol/L   Chloride 103 101 - 111 mmol/L   CO2 24 22 - 32 mmol/L   Glucose, Bld 85 65 - 99 mg/dL   BUN 22 (H) 6 - 20 mg/dL   Creatinine, Ser 0.82 0.61 - 1.24 mg/dL   Calcium 9.7 8.9 - 10.3 mg/dL   Total Protein 7.7 6.5 - 8.1 g/dL   Albumin 4.5 3.5 - 5.0 g/dL   AST 44 (H) 15 - 41 U/L   ALT 51 17 - 63 U/L   Alkaline  Phosphatase 112 38 - 126 U/L   Total Bilirubin 0.8 0.3 - 1.2 mg/dL   GFR calc non Af Amer >60 >60 mL/min   GFR calc Af Amer >60 >60 mL/min    Comment: (NOTE) The eGFR has been calculated using the CKD EPI equation. This calculation has not been validated in all clinical situations. eGFR's persistently <60 mL/min signify possible Chronic Kidney Disease.    Anion gap 9 5 - 15  Ethanol     Status: None   Collection Time: 10/29/16  9:44 PM  Result Value Ref Range   Alcohol, Ethyl (B) <5 <5 mg/dL    Comment:        LOWEST DETECTABLE LIMIT FOR SERUM ALCOHOL IS 5 mg/dL FOR MEDICAL PURPOSES ONLY   Salicylate level     Status: None   Collection Time: 10/29/16  9:44 PM  Result Value Ref Range   Salicylate Lvl <0.3 2.8 - 30.0 mg/dL  Acetaminophen level     Status: Abnormal   Collection Time: 10/29/16  9:44 PM  Result Value Ref Range   Acetaminophen (Tylenol), Serum <10 (L) 10 - 30 ug/mL    Comment:        THERAPEUTIC CONCENTRATIONS VARY SIGNIFICANTLY. A RANGE OF 10-30 ug/mL MAY BE AN EFFECTIVE CONCENTRATION FOR MANY PATIENTS. HOWEVER, SOME ARE BEST TREATED AT CONCENTRATIONS OUTSIDE THIS RANGE. ACETAMINOPHEN CONCENTRATIONS >150 ug/mL AT 4 HOURS AFTER INGESTION AND >50 ug/mL AT 12 HOURS AFTER INGESTION ARE OFTEN ASSOCIATED WITH TOXIC REACTIONS.   cbc     Status: Abnormal   Collection Time: 10/29/16  9:44 PM  Result Value Ref Range   WBC 7.5 4.0 - 10.5 K/uL   RBC 4.52 4.22 - 5.81 MIL/uL   Hemoglobin 15.0 13.0 - 17.0 g/dL   HCT 44.3 39.0 - 52.0 %   MCV 98.0 78.0 - 100.0 fL   MCH 33.2 26.0 - 34.0 pg   MCHC 33.9 30.0 - 36.0 g/dL   RDW 14.5 11.5 - 15.5 %   Platelets 129 (L) 150 - 400 K/uL  Rapid urine drug screen (hospital performed)     Status: Abnormal   Collection Time: 10/30/16  6:50 AM  Result Value Ref Range   Opiates NONE DETECTED NONE DETECTED   Cocaine NONE DETECTED NONE DETECTED   Benzodiazepines POSITIVE (A) NONE DETECTED   Amphetamines NONE DETECTED NONE  DETECTED   Tetrahydrocannabinol POSITIVE (A) NONE DETECTED   Barbiturates NONE DETECTED NONE DETECTED    Comment:        DRUG SCREEN FOR MEDICAL PURPOSES ONLY.  IF CONFIRMATION IS NEEDED  FOR ANY PURPOSE, NOTIFY LAB WITHIN 5 DAYS.        LOWEST DETECTABLE LIMITS FOR URINE DRUG SCREEN Drug Class       Cutoff (ng/mL) Amphetamine      1000 Barbiturate      200 Benzodiazepine   101 Tricyclics       751 Opiates          300 Cocaine          300 THC              50     Current Facility-Administered Medications  Medication Dose Route Frequency Provider Last Rate Last Dose  . folic acid (FOLVITE) tablet 1 mg  1 mg Oral Daily Patrecia Pour, NP   1 mg at 10/30/16 0941  . LORazepam (ATIVAN) tablet 1 mg  1 mg Oral Q6H PRN Patrecia Pour, NP       Or  . LORazepam (ATIVAN) injection 1 mg  1 mg Intravenous Q6H PRN Patrecia Pour, NP      . LORazepam (ATIVAN) tablet 0-4 mg  0-4 mg Oral Q6H Fatima Blank, MD   2 mg at 10/30/16 0258   Followed by  . [START ON 11/01/2016] LORazepam (ATIVAN) tablet 0-4 mg  0-4 mg Oral Q12H Fatima Blank, MD      . LORazepam (ATIVAN) tablet 1 mg  1 mg Oral Once Patrecia Pour, NP      . multivitamin with minerals tablet 1 tablet  1 tablet Oral Daily Patrecia Pour, NP   1 tablet at 10/30/16 0941  . thiamine (VITAMIN B-1) tablet 100 mg  100 mg Oral Daily Fatima Blank, MD   100 mg at 10/30/16 0941   Or  . thiamine (B-1) injection 100 mg  100 mg Intravenous Daily Fatima Blank, MD       Current Outpatient Prescriptions  Medication Sig Dispense Refill  . dicyclomine (BENTYL) 20 MG tablet Take 1 tablet (20 mg total) by mouth 2 (two) times daily as needed for spasms. 20 tablet 0  . fluticasone furoate-vilanterol (BREO ELLIPTA) 100-25 MCG/INH AEPB Inhale 1 puff into the lungs daily.    Marland Kitchen gabapentin (NEURONTIN) 300 MG capsule Take 1 capsule (300 mg total) by mouth 3 (three) times daily. 90 capsule 0  . hydrOXYzine (ATARAX/VISTARIL) 25 MG  tablet Take 1 tablet (25 mg total) by mouth every 6 (six) hours as needed for anxiety. 30 tablet 0  . nicotine (NICODERM CQ - DOSED IN MG/24 HOURS) 21 mg/24hr patch Place 1 patch (21 mg total) onto the skin daily. 28 patch 0  . pantoprazole (PROTONIX) 20 MG tablet Take 1 tablet (20 mg total) by mouth daily. 30 tablet 0  . prazosin (MINIPRESS) 2 MG capsule Take 1 capsule (2 mg total) by mouth at bedtime. 30 capsule 0  . QUEtiapine (SEROQUEL) 25 MG tablet Take 1 tablet (25 mg total) by mouth 3 (three) times daily. 90 tablet 0  . QUEtiapine (SEROQUEL) 50 MG tablet Take 3 tablets (150 mg total) by mouth at bedtime. 90 tablet 0  . simethicone (GAS-X) 80 MG chewable tablet Chew 1 tablet (80 mg total) by mouth every 6 (six) hours as needed for flatulence. 30 tablet 0  . umeclidinium bromide (INCRUSE ELLIPTA) 62.5 MCG/INH AEPB Inhale 1 puff into the lungs daily.    Marland Kitchen venlafaxine XR (EFFEXOR-XR) 75 MG 24 hr capsule Take 1 capsule (75 mg total) by mouth daily with breakfast. 30 capsule  0    Musculoskeletal: Strength & Muscle Tone: within normal limits Gait & Station: normal Patient leans: N/A  Psychiatric Specialty Exam: Physical Exam  Constitutional: He is oriented to person, place, and time. He appears well-developed and well-nourished.  HENT:  Head: Normocephalic.  Neck: Normal range of motion.  Respiratory: Effort normal.  Musculoskeletal: Normal range of motion.  Neurological: He is alert and oriented to person, place, and time.  Psychiatric: His speech is normal and behavior is normal. Judgment and thought content normal. Cognition and memory are normal. He exhibits a depressed mood.    Review of Systems  Psychiatric/Behavioral: Positive for depression and substance abuse.  All other systems reviewed and are negative.   Blood pressure (!) 134/101, pulse 98, temperature 98 F (36.7 C), temperature source Oral, resp. rate 18, SpO2 95 %.There is no height or weight on file to calculate BMI.   General Appearance: Casual  Eye Contact:  Good  Speech:  Normal Rate  Volume:  Normal  Mood:  Depressed, mild  Affect:  Congruent  Thought Process:  Coherent and Descriptions of Associations: Intact  Orientation:  Full (Time, Place, and Person)  Thought Content:  WDL  Suicidal Thoughts:  No  Homicidal Thoughts:  No  Memory:  Immediate;   Good Recent;   Good Remote;   Good  Judgement:  Fair  Insight:  Fair  Psychomotor Activity:  Normal  Concentration:  Concentration: Good and Attention Span: Good  Recall:  Good  Fund of Knowledge:  Fair  Language:  Good  Akathisia:  No  Handed:  Right  AIMS (if indicated):     Assets:  Leisure Time Physical Health Resilience  ADL's:  Intact  Cognition:  WNL  Sleep:        Treatment Plan Summary: Daily contact with patient to assess and evaluate symptoms and progress in treatment, Medication management and Plan alcohol abuse with alcohol induced mood disorder:  -Crisis stabilization -Medication management:  Ativan alcohol detox protocol started -Individual and substance abuse counseling -Referral information for rehabs provided  Disposition: No evidence of imminent risk to self or others at present.    Waylan Boga, NP 10/30/2016 10:54 AM  Patient seen face-to-face for psychiatric evaluation, chart reviewed and case discussed with the physician extender and developed treatment plan. Reviewed the information documented and agree with the treatment plan. Corena Pilgrim, MD

## 2016-10-30 NOTE — Progress Notes (Signed)
CSW spoke with patient at bedside. CSW informed patient that psychiatrist recommends that patient follow up with [facility]. CSW inquired about patient's familiarity with the facility, patient reported that he was familiar with the facility. CSW encouraged patient to follow up with [facility] to start the process of obtaining treatment, CSW provided patient with handout that contained [facility]'s contact information.

## 2016-10-30 NOTE — ED Notes (Signed)
Manual blood pressure-134/90

## 2016-10-30 NOTE — ED Notes (Signed)
Patient has been cooperative on the unit.  He denies thoughts of harm to self or others.  States he wants to get into a rehab facility for 30 days.  I advised him that he would have to find a rehab on his own and gave him a list of resources that he would be able to call.  He was supposed to have gone to [facility] earlier this month but states he does not like the people there.  He has made a few phone calls today, but I am unsure if he has found a rehab to accept him.  States he does not know how he is going to get home.  I advised he could have a bus pass on release if he needed one.

## 2016-10-30 NOTE — [facility] Suicide Risk Assessment (Signed)
Suicide Risk Assessment  Discharge Assessment   [facility]   Principal Problem: Alcohol-induced mood disorder [facility](HCC) Discharge Diagnoses:  Patient Active Problem List   Diagnosis Date Noted  . Alcohol-induced mood disorder ([facility]) [F10.94] 10/06/2016    Priority: High  . Alcohol use disorder, severe, dependence ([facility]) [F10.20] 07/20/2016    Priority: High  . GERD (gastroesophageal reflux disease) [K21.9] 07/20/2016  . HTN (hypertension) [I10] 07/20/2016  . PTSD (post-traumatic stress disorder) [F43.10] 07/20/2016  . Tobacco use disorder [F17.200] 07/20/2016  . Closed right forearm fracture [S52.91XA] 07/20/2016  . Major depressive disorder, recurrent, severe with psychotic features ([facility]) [F33.3] 07/19/2016    Total Time spent with patient: 45 minutes  Musculoskeletal: Strength & Muscle Tone: within normal limits Gait & Station: normal Patient leans: N/A  Psychiatric Specialty Exam: Physical Exam  Constitutional: He is oriented to person, place, and time. He appears well-developed and well-nourished.  HENT:  Head: Normocephalic.  Neck: Normal range of motion.  Respiratory: Effort normal.  Musculoskeletal: Normal range of motion.  Neurological: He is alert and oriented to person, place, and time.  Psychiatric: His speech is normal and behavior is normal. Judgment and thought content normal. Cognition and memory are normal. He exhibits a depressed mood.    Review of Systems  Psychiatric/Behavioral: Positive for depression and substance abuse.  All other systems reviewed and are negative.   Blood pressure (!) 134/101, pulse 98, temperature 98 F (36.7 C), temperature source Oral, resp. rate 18, SpO2 95 %.There is no height or weight on file to calculate BMI.  General Appearance: Casual  Eye Contact:  Good  Speech:  Normal Rate  Volume:  Normal  Mood:  Depressed, mild  Affect:  Congruent  Thought Process:  Coherent and Descriptions of Associations:  Intact  Orientation:  Full (Time, Place, and Person)  Thought Content:  WDL  Suicidal Thoughts:  No  Homicidal Thoughts:  No  Memory:  Immediate;   Good Recent;   Good Remote;   Good  Judgement:  Fair  Insight:  Fair  Psychomotor Activity:  Normal  Concentration:  Concentration: Good and Attention Span: Good  Recall:  Good  Fund of Knowledge:  Fair  Language:  Good  Akathisia:  No  Handed:  Right  AIMS (if indicated):     Assets:  Leisure Time Physical Health Resilience  ADL's:  Intact  Cognition:  WNL  Sleep:      Mental Status Per Nursing Assessment::   On Admission:   alcohol abuse assistance  Demographic Factors:  Male and Caucasian  Loss Factors: NA  Historical Factors: NA  Risk Reduction Factors:   Sense of responsibility to family, Living with another person, especially a relative and Positive social support  Continued Clinical Symptoms:  Depression, mild  Cognitive Features That Contribute To Risk:  None    Suicide Risk:  Minimal: No identifiable suicidal ideation.  Patients presenting with no risk factors but with morbid ruminations; may be classified as minimal risk based on the severity of the depressive symptoms    Plan Of Care/Follow-up recommendations:  Activity:  as tolerated Diet:  heart healthy diet  LORD, JAMISON, NP 10/30/2016, 11:30 AM

## 2016-10-30 NOTE — ED Notes (Signed)
Patient discharged to home.  He left the unit ambulatory.  All belongings were returned and signed for.  He was escorted to the front lobby and was given a bus pass per his request.

## 2018-04-06 IMAGING — CT CT ABD-PELV W/ CM
2 of 5 series · 16 of 46 positions shown, 18 images · IV contrast (ISOVUE)
Comparison: 07/17/2016

CLINICAL DATA: Abdominal pain and bloating for several days

EXAM:
CT ABDOMEN AND PELVIS WITH CONTRAST
TECHNIQUE: Multidetector CT imaging of the abdomen and pelvis was performed
using the standard protocol following bolus administration of
intravenous contrast.
CONTRAST:  100mL RGE6CJ-OMM IOPAMIDOL (RGE6CJ-OMM) INJECTION 61%

[Series 2: abd/pel with · axial · 0.85mm/px · z∈[-636,-246]mm · 13 of 90 slices shown, 15 images]
[im 6/90  soft-tissue]
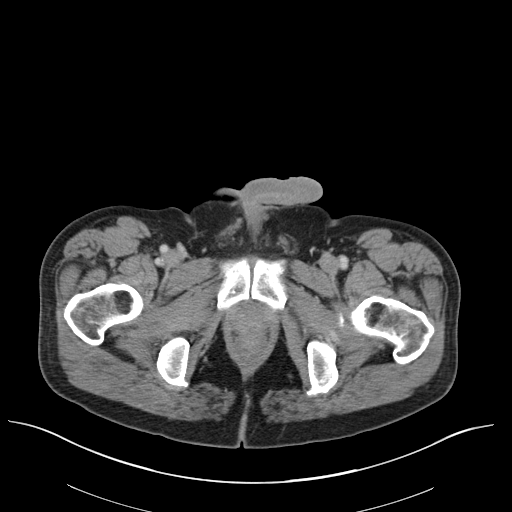
[im 6/90  bone]
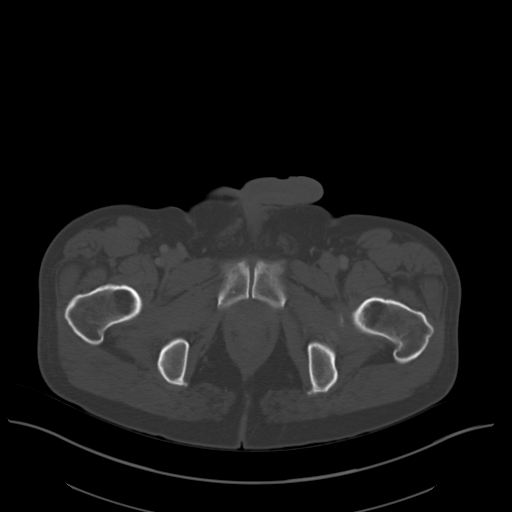
[im 12/90  soft-tissue]
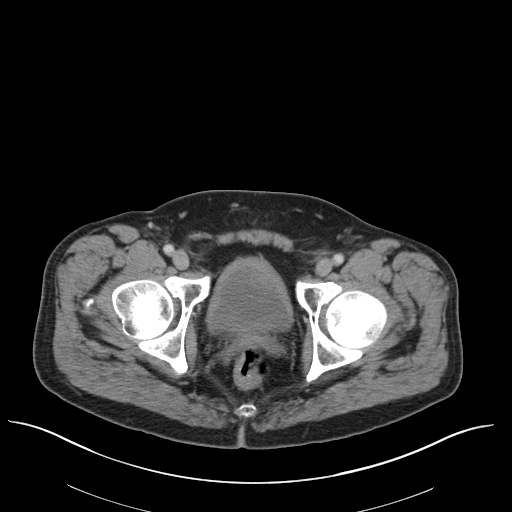
[im 17/90  soft-tissue]
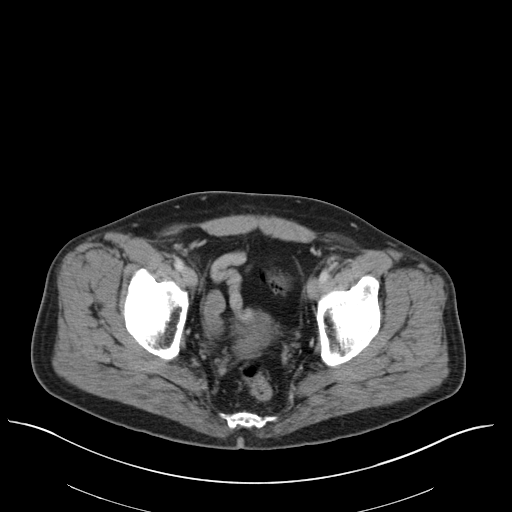
[im 28/90  soft-tissue]
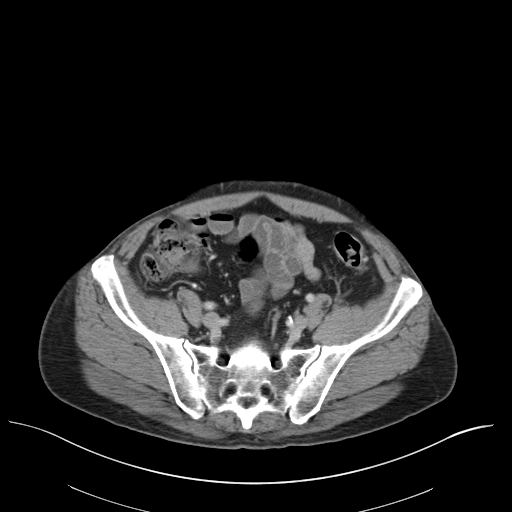
[im 34/90  soft-tissue]
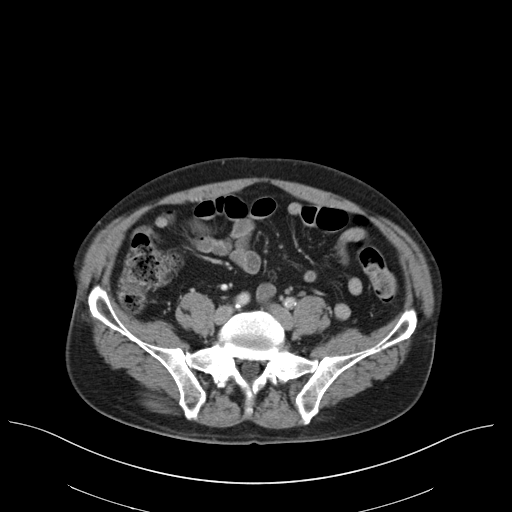
[im 39/90  soft-tissue]
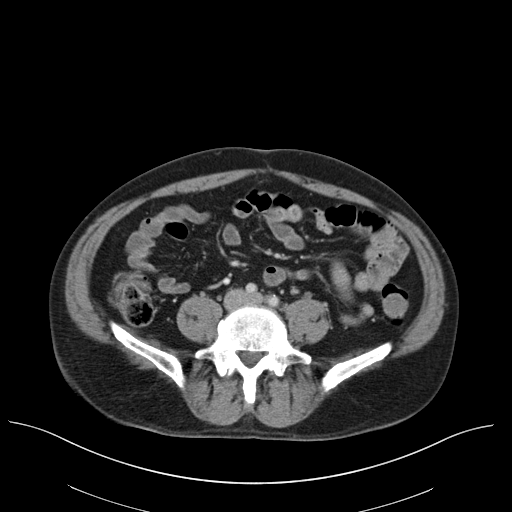
[im 45/90  soft-tissue]
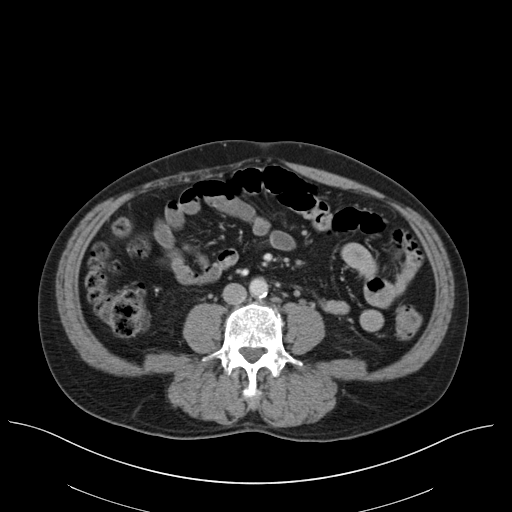
[im 51/90  soft-tissue]
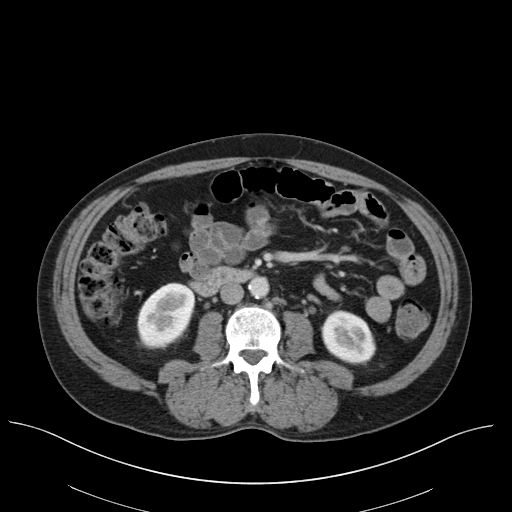
[im 56/90  soft-tissue]
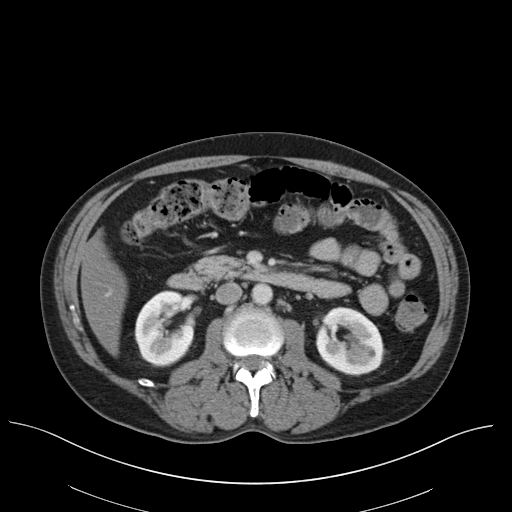
[im 56/90  bone]
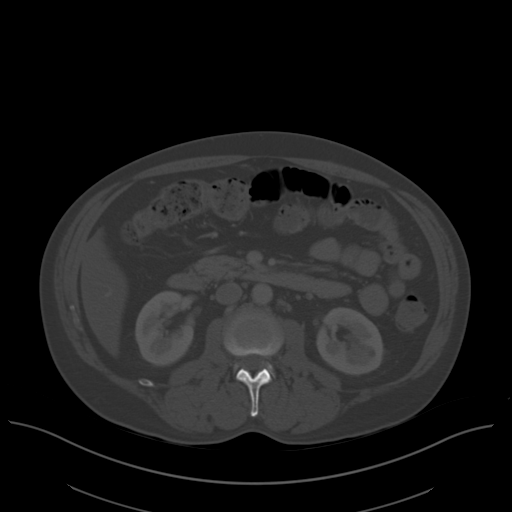
[im 62/90  soft-tissue]
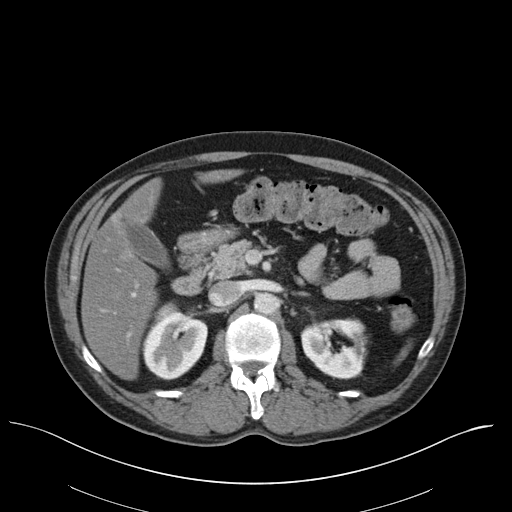
[im 73/90  soft-tissue]
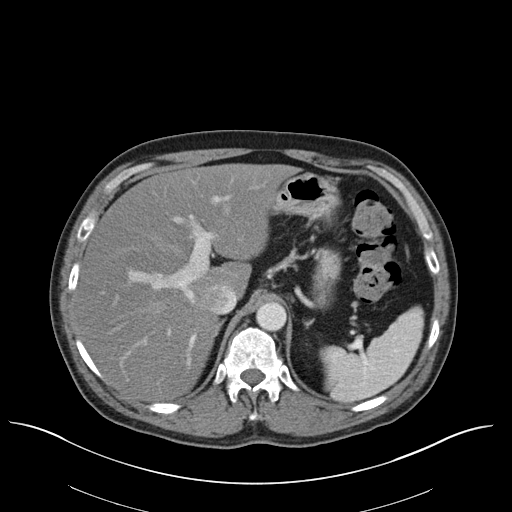
[im 78/90  soft-tissue]
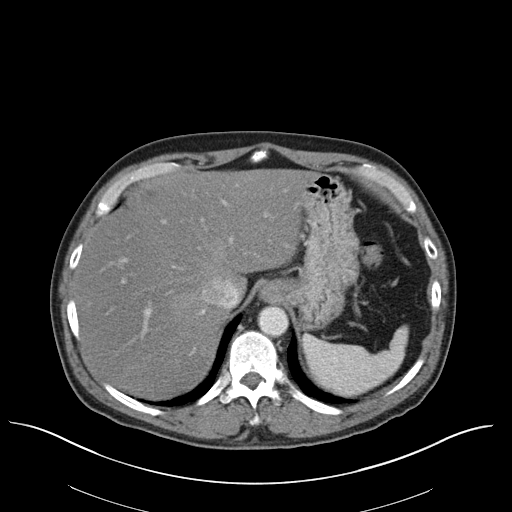
[im 84/90  soft-tissue]
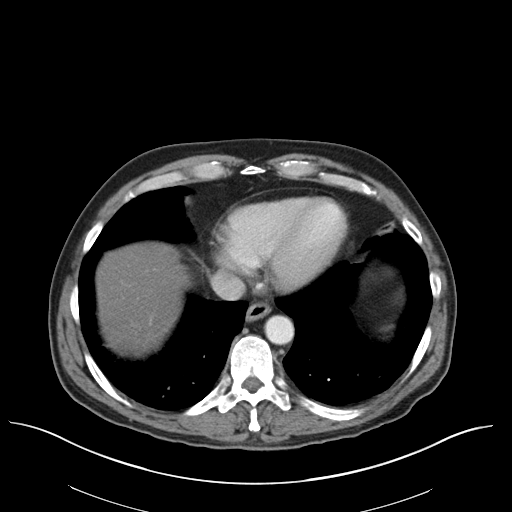

[Series 5: coronal a/|p · coronal · 0.77mm/px · 3 of 146 slices shown]
[im 49/146  soft-tissue]
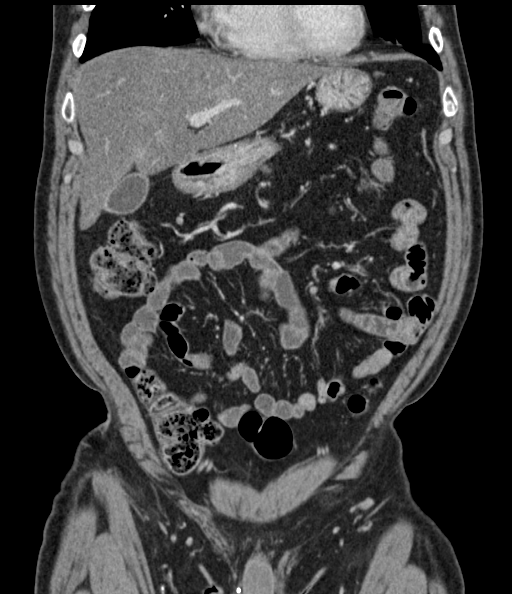
[im 65/146  soft-tissue]
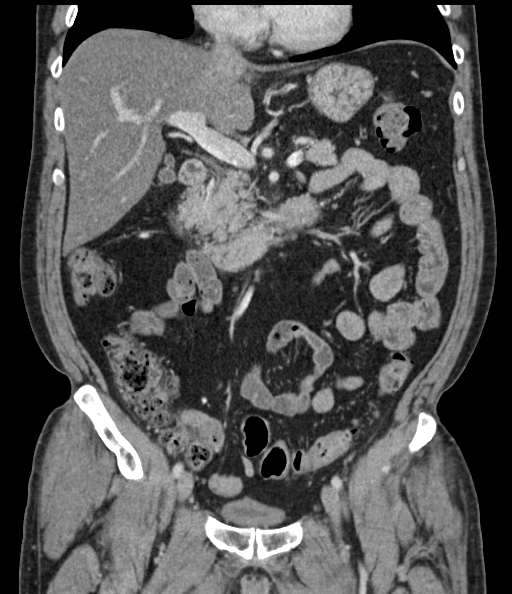
[im 81/146  soft-tissue]
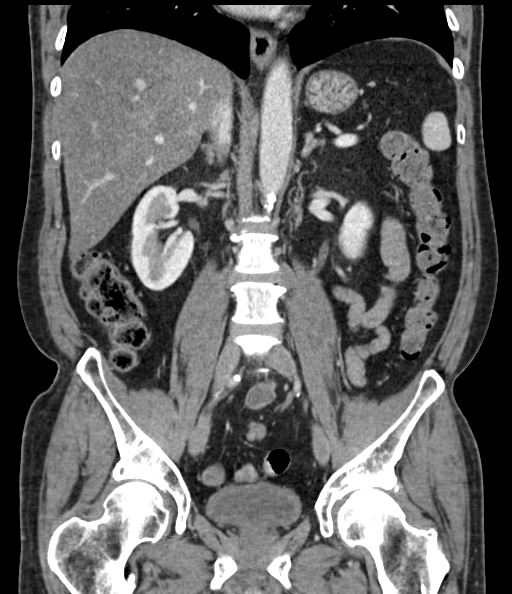

[16 of 46 positions shown; findings below may reference images not displayed]

FINDINGS: Lower chest: No acute abnormality.

Hepatobiliary: The liver is diffusely fatty infiltrated. No focal
mass lesion is noted. Gallbladder is within normal limits.

Pancreas: Unremarkable. No pancreatic ductal dilatation or
surrounding inflammatory changes.

Spleen: Normal in size without focal abnormality.

Adrenals/Urinary Tract: The adrenal glands are within normal limits.
The left kidney demonstrates some mild scarring as well as a tiny
nonobstructing upper pole stone. No obstructive changes are
identified. The ureters are within normal limits. The bladder is
decompressed.

Stomach/Bowel: Scattered diverticular change of the colon is noted
without evidence of diverticulitis. No obstructive changes are seen.
The appendix has been surgically removed.

Vascular/Lymphatic: Aortic atherosclerosis. No enlarged abdominal or
pelvic lymph nodes.

Reproductive: Prostate is unremarkable.

Other: No abdominal wall hernia or abnormality. No abdominopelvic
ascites.

Musculoskeletal: No acute or significant osseous findings.
IMPRESSION: Diverticulosis without diverticulitis.

Nonobstructing left renal stone.

No acute abnormality is seen.

## 2021-11-08 DEATH — deceased
